# Patient Record
Sex: Male | Born: 1935 | Race: White | Hispanic: No | Marital: Married | Smoking: Never smoker
Health system: Southern US, Community
[De-identification: ages and names within clinical notes are randomized; demographics above are authoritative.]

## PROBLEM LIST (undated history)

## (undated) DIAGNOSIS — K219 Gastro-esophageal reflux disease without esophagitis: Secondary | ICD-10-CM

## (undated) DIAGNOSIS — I509 Heart failure, unspecified: Secondary | ICD-10-CM

## (undated) DIAGNOSIS — I10 Essential (primary) hypertension: Secondary | ICD-10-CM

## (undated) DIAGNOSIS — G629 Polyneuropathy, unspecified: Secondary | ICD-10-CM

## (undated) DIAGNOSIS — C801 Malignant (primary) neoplasm, unspecified: Secondary | ICD-10-CM

## (undated) DIAGNOSIS — I429 Cardiomyopathy, unspecified: Secondary | ICD-10-CM

## (undated) DIAGNOSIS — E785 Hyperlipidemia, unspecified: Secondary | ICD-10-CM

## (undated) DIAGNOSIS — I4891 Unspecified atrial fibrillation: Secondary | ICD-10-CM

## (undated) HISTORY — DX: Cardiomyopathy, unspecified: I42.9

## (undated) HISTORY — PX: VASECTOMY: SHX75

## (undated) HISTORY — DX: Malignant (primary) neoplasm, unspecified: C80.1

## (undated) HISTORY — DX: Hyperlipidemia, unspecified: E78.5

## (undated) HISTORY — PX: FRACTURE SURGERY: SHX138

## (undated) HISTORY — PX: HERNIA REPAIR: SHX51

## (undated) HISTORY — DX: Polyneuropathy, unspecified: G62.9

---

## 2004-11-21 DIAGNOSIS — C801 Malignant (primary) neoplasm, unspecified: Secondary | ICD-10-CM

## 2004-11-21 HISTORY — DX: Malignant (primary) neoplasm, unspecified: C80.1

## 2011-03-08 ENCOUNTER — Emergency Department: Payer: Self-pay | Admitting: Emergency Medicine

## 2011-06-03 DIAGNOSIS — E782 Mixed hyperlipidemia: Secondary | ICD-10-CM | POA: Insufficient documentation

## 2011-06-03 DIAGNOSIS — M545 Low back pain, unspecified: Secondary | ICD-10-CM | POA: Insufficient documentation

## 2011-06-03 DIAGNOSIS — R351 Nocturia: Secondary | ICD-10-CM | POA: Insufficient documentation

## 2011-12-07 ENCOUNTER — Ambulatory Visit: Payer: Self-pay

## 2012-09-17 DIAGNOSIS — S43409A Unspecified sprain of unspecified shoulder joint, initial encounter: Secondary | ICD-10-CM | POA: Insufficient documentation

## 2012-09-25 DIAGNOSIS — N400 Enlarged prostate without lower urinary tract symptoms: Secondary | ICD-10-CM | POA: Insufficient documentation

## 2012-10-24 DIAGNOSIS — H903 Sensorineural hearing loss, bilateral: Secondary | ICD-10-CM | POA: Insufficient documentation

## 2012-11-02 DIAGNOSIS — M75100 Unspecified rotator cuff tear or rupture of unspecified shoulder, not specified as traumatic: Secondary | ICD-10-CM | POA: Insufficient documentation

## 2012-12-28 DIAGNOSIS — M7520 Bicipital tendinitis, unspecified shoulder: Secondary | ICD-10-CM | POA: Insufficient documentation

## 2013-04-12 DIAGNOSIS — Z961 Presence of intraocular lens: Secondary | ICD-10-CM | POA: Insufficient documentation

## 2013-11-15 ENCOUNTER — Ambulatory Visit: Payer: Self-pay | Admitting: Family Medicine

## 2014-01-21 DIAGNOSIS — H02419 Mechanical ptosis of unspecified eyelid: Secondary | ICD-10-CM | POA: Insufficient documentation

## 2014-02-25 DIAGNOSIS — H02839 Dermatochalasis of unspecified eye, unspecified eyelid: Secondary | ICD-10-CM | POA: Insufficient documentation

## 2014-09-03 DIAGNOSIS — I482 Chronic atrial fibrillation, unspecified: Secondary | ICD-10-CM | POA: Insufficient documentation

## 2015-01-07 DIAGNOSIS — I071 Rheumatic tricuspid insufficiency: Secondary | ICD-10-CM | POA: Insufficient documentation

## 2015-05-12 DIAGNOSIS — I351 Nonrheumatic aortic (valve) insufficiency: Secondary | ICD-10-CM | POA: Insufficient documentation

## 2015-05-19 DIAGNOSIS — I493 Ventricular premature depolarization: Secondary | ICD-10-CM | POA: Insufficient documentation

## 2015-06-02 ENCOUNTER — Emergency Department
Admission: EM | Admit: 2015-06-02 | Discharge: 2015-06-02 | Disposition: A | Discharge status: Home / Self Care | Payer: Medicare Other | Attending: Emergency Medicine | Admitting: Emergency Medicine

## 2015-06-02 ENCOUNTER — Encounter: Payer: Self-pay | Admitting: *Deleted

## 2015-06-02 ENCOUNTER — Other Ambulatory Visit: Payer: Self-pay

## 2015-06-02 DIAGNOSIS — R55 Syncope and collapse: Secondary | ICD-10-CM | POA: Diagnosis not present

## 2015-06-02 DIAGNOSIS — Y9289 Other specified places as the place of occurrence of the external cause: Secondary | ICD-10-CM | POA: Insufficient documentation

## 2015-06-02 DIAGNOSIS — T675XXA Heat exhaustion, unspecified, initial encounter: Secondary | ICD-10-CM | POA: Insufficient documentation

## 2015-06-02 DIAGNOSIS — Y998 Other external cause status: Secondary | ICD-10-CM | POA: Diagnosis not present

## 2015-06-02 DIAGNOSIS — X30XXXA Exposure to excessive natural heat, initial encounter: Secondary | ICD-10-CM | POA: Insufficient documentation

## 2015-06-02 DIAGNOSIS — I10 Essential (primary) hypertension: Secondary | ICD-10-CM | POA: Diagnosis not present

## 2015-06-02 DIAGNOSIS — Y9389 Activity, other specified: Secondary | ICD-10-CM | POA: Diagnosis not present

## 2015-06-02 HISTORY — DX: Gastro-esophageal reflux disease without esophagitis: K21.9

## 2015-06-02 HISTORY — DX: Unspecified atrial fibrillation: I48.91

## 2015-06-02 HISTORY — DX: Essential (primary) hypertension: I10

## 2015-06-02 LAB — CBC WITH DIFFERENTIAL/PLATELET
BASOS PCT: 1 %
Basophils Absolute: 0.1 10*3/uL (ref 0–0.1)
Eosinophils Absolute: 0.1 10*3/uL (ref 0–0.7)
Eosinophils Relative: 1 %
HCT: 39.1 % — ABNORMAL LOW (ref 40.0–52.0)
HEMOGLOBIN: 13.2 g/dL (ref 13.0–18.0)
LYMPHS ABS: 2.3 10*3/uL (ref 1.0–3.6)
Lymphocytes Relative: 33 %
MCH: 32.4 pg (ref 26.0–34.0)
MCHC: 33.7 g/dL (ref 32.0–36.0)
MCV: 96.4 fL (ref 80.0–100.0)
MONO ABS: 0.5 10*3/uL (ref 0.2–1.0)
Monocytes Relative: 8 %
Neutro Abs: 4.1 10*3/uL (ref 1.4–6.5)
Neutrophils Relative %: 57 %
Platelets: 145 10*3/uL — ABNORMAL LOW (ref 150–440)
RBC: 4.06 MIL/uL — ABNORMAL LOW (ref 4.40–5.90)
RDW: 14.1 % (ref 11.5–14.5)
WBC: 7.1 10*3/uL (ref 3.8–10.6)

## 2015-06-02 LAB — BASIC METABOLIC PANEL
Anion gap: 9 (ref 5–15)
BUN: 13 mg/dL (ref 6–20)
CO2: 25 mmol/L (ref 22–32)
Calcium: 8.9 mg/dL (ref 8.9–10.3)
Chloride: 106 mmol/L (ref 101–111)
Creatinine, Ser: 1.33 mg/dL — ABNORMAL HIGH (ref 0.61–1.24)
GFR calc Af Amer: 56 mL/min — ABNORMAL LOW (ref >60)
GFR calc non Af Amer: 48 mL/min — ABNORMAL LOW (ref >60)
Glucose, Bld: 107 mg/dL — ABNORMAL HIGH (ref 65–99)
Potassium: 3.7 mmol/L (ref 3.5–5.1)
SODIUM: 140 mmol/L (ref 135–145)

## 2015-06-02 LAB — TROPONIN I: Troponin I: <0.03 ng/mL (ref <0.031)

## 2015-06-02 NOTE — ED Notes (Signed)
Per EMS:  Pt went on a walk this morning and was then working in the yard.  Became hot and felt faint.  Denies syncope, denies falling.  Pt A/O x 4.  Per EMS, on arrival, no radial pulse felt, hypotensive.  Pt denies CP, SOB.

## 2015-06-02 NOTE — ED Provider Notes (Signed)
[facility] Emergency Department Provider Note  ____________________________________________  Time seen: Seen upon arrival to the emergency department  I have reviewed the triage vital signs and the nursing notes.   HISTORY  Chief Complaint Heat Exposure    HPI Kenneth Watson is a 81 y.o. male with a history of atrial fibrillation and hypertension who presents today after having a near-syncopal episode. The patient said that he went on a 2 mile walk and then started doing yardwork. He said that he drank a third cup of coffee this morning and no water. While working in the are the patient became acutely diaphoretic with feelings of near syncope. Denies any nausea, chest pain or shortness of breath. He was able to lower himself to the ground without falling. Denies any pain at this time. No complaints at this time. Sustained an abrasion to his left upper extremity. Says that he just had a tetanus shot one month ago. Denies feeling his heart racing. Initially in the ambulance the patient did not have a detectable blood pressure however had a repeat pressure after fluids with his systolic in the 16X.   Past Medical History  Diagnosis Date  . Atrial fibrillation   . Hypertension   . GERD (gastroesophageal reflux disease)     There are no active problems to display for this patient.   Past Surgical History  Procedure Laterality Date  . Hernia repair    . Vasectomy      Current Outpatient Rx  Name  Route  Sig  Dispense  Refill  . apixaban (ELIQUIS) 5 MG TABS tablet   Oral   Take 5 mg by mouth 2 (two) times daily.         Marland Kitchen atenolol (TENORMIN) 25 MG tablet   Oral   Take 25 mg by mouth 2 (two) times daily.          Marland Kitchen omeprazole (PRILOSEC) 20 MG capsule   Oral   Take 20 mg by mouth daily.         Marland Kitchen PRESCRIPTION MEDICATION               . sacubitril-valsartan (ENTRESTO) 24-26 MG   Oral   Take 1 tablet by mouth 2 (two) times daily.            Allergies Septra and Sulfur  History reviewed. No pertinent family history.  Social History History  Substance Use Topics  . Smoking status: Never Smoker   . Smokeless tobacco: Not on file  . Alcohol Use: No    Review of Systems Constitutional: No fever/chills Eyes: No visual changes. ENT: No sore throat. Cardiovascular: Denies chest pain. Respiratory: Denies shortness of breath. Gastrointestinal: No abdominal pain.  No nausea, no vomiting.  No diarrhea.  No constipation. Genitourinary: Negative for dysuria. Musculoskeletal: Negative for back pain. Skin: Negative for rash. Neurological: Negative for headaches, focal weakness or numbness.  10-point ROS otherwise negative.  ____________________________________________   PHYSICAL EXAM:  VITAL SIGNS: ED Triage Vitals  Enc Vitals Group     BP 06/02/15 1359 97/64 mmHg     Pulse Rate 06/02/15 1359 75     Resp 06/02/15 1359 18     Temp 06/02/15 1359 98.1 F (36.7 C)     Temp Source 06/02/15 1359 Oral     SpO2 06/02/15 1359 100 %     Weight 06/02/15 1359 160 lb (72.576 kg)     Height 06/02/15 1359 5\' 10"  (1.778 m)  Head Cir --      Peak Flow --      Pain Score --      Pain Loc --      Pain Edu? --      Excl. in [place]? --     Constitutional: Alert and oriented. Well appearing and in no acute distress. Eyes: Conjunctivae are normal. PERRL. EOMI. Head: Atraumatic. Nose: No congestion/rhinnorhea. Mouth/Throat: Mucous membranes are moist.  Oropharynx non-erythematous. Neck: No stridor.   Cardiovascular: Irregularly irregular rhythm. Grossly normal heart sounds.  Good peripheral circulation. Respiratory: Normal respiratory effort.  No retractions. Lungs CTAB. Gastrointestinal: Soft and nontender. No distention. No abdominal bruits. No CVA tenderness. Musculoskeletal: No lower extremity tenderness nor edema.  No joint effusions. Neurologic:  Normal speech and language. No gross focal neurologic deficits are  appreciated. Speech is normal. No gait instability. Skin:  Skin is warm, dry and intact. No rash noted. Psychiatric: Mood and affect are normal. Speech and behavior are normal.  ____________________________________________   LABS (all labs ordered are listed, but only abnormal results are displayed)  Labs Reviewed  CBC WITH DIFFERENTIAL/PLATELET - Abnormal; Notable for the following:    RBC 4.06 (*)    HCT 39.1 (*)    Platelets 145 (*)    All other components within normal limits  BASIC METABOLIC PANEL - Abnormal; Notable for the following:    Glucose, Bld 107 (*)    Creatinine, Ser 1.33 (*)    GFR calc non Af Amer 48 (*)    GFR calc Af Amer 56 (*)    All other components within normal limits  TROPONIN I   ____________________________________________  EKG  ED ECG REPORT I, Doran Stabler, the attending physician, personally viewed and interpreted this ECG.   Date: 06/02/2015  EKG Time: 1358  Rate: 93  Rhythm: atrial fibrillation, rate 93  Axis: Normal axis  Intervals:Long QT interval  ST&T Change: T-wave inversion in aVF. PVC 2  No previous for comparison  ____________________________________________  RADIOLOGY   ____________________________________________   PROCEDURES    ____________________________________________   INITIAL IMPRESSION / ASSESSMENT AND PLAN / ED COURSE  Pertinent labs & imaging results that were available during my care of the patient were reviewed by me and considered in my medical decision making (see chart for details).  ----------------------------------------- 5:23 PM on 06/02/2015 -----------------------------------------  Patient with blood pressures now to the 90s. Last to systolic pressures being 96 and 92. Patient is currently asymptomatic. Labs are reassuring. Presentation consistent with patient being out in the heat for multiple hours today. Has an echo cardiogram scheduled tomorrow and then will follow-up with Dr.  Nehemiah Massed. Encouraged patient to drink plenty of water at home as well as 2 restrict his time outside in the heat. We will discharge to home. ____________________________________________   FINAL CLINICAL IMPRESSION(S) / ED DIAGNOSES   Acute heat exhaustion with near syncope. Initial visit.   Orbie Pyo, MD 06/02/15 1725

## 2015-06-02 NOTE — Discharge Instructions (Signed)
Heat Stress in the Elderly  Elderly people (people aged 79 years and older) are more prone to heat stress than younger people for several reasons:   Elderly people do not adjust as well as young people to sudden changes in temperature.  They are more likely to have a chronic medical condition that upsets normal body responses to heat.  They are more likely to take prescription medicines that impair the body's ability to regulate its temperature or that inhibit perspiration. HEAT STROKE  Heat stroke is the most serious heat-related illness. It occurs when the body becomes unable to control its temperature. The body temperature rises rapidly. Then the body loses its ability to sweat and is unable to cool down. The body temperature rises to 105 F (40.6 C) or higher within 10 to 15 minutes. Heat stroke can cause death or permanent disability if emergency treatment is not provided. SYMPTOMS  Warning signs vary but may include the following:  An extremely high body temperature (above 103 F (39.4 C)).  Nausea.  Red, hot, and dry skin (no sweating).  Rapid, strong pulse.  Throbbing headache.  Dizziness. HEAT EXHAUSTION  Heat exhaustion is a milder form of heat-related illness. It can develop after several days of exposure to high temperatures and not enough fluids. SYMPTOMS  Warning signs vary but may include the following:   Heavy sweating. Paleness.  Muscle cramps.  Tiredness. Weakness.  Dizziness.  Headache. Nausea or vomiting.  Fainting.  Skin: may be cool and moist.  Pulse rate: fast and weak.  Breathing: fast and shallow. WHAT YOU CAN DO TO PROTECT YOURSELF  You can follow these prevention tips to protect yourself from heat-related stress:   Drink cool, nonalcoholic, non-caffeinated beverages. If your caregiver generally limits the amount of fluid you drink or has you on water pills, ask how much you should drink when the weather is hot. Avoid extremely cold  liquids. They can cause cramps.  Rest.  Take a cool shower, bath, or sponge bath.  If possible, seek an air-conditioned environment. If you do not have air conditioning, visit an air-conditioned shopping mall or [place] to cool off.  Emergency planning/management officer.  If possible, remain indoors in the heat of the day.  Do not engage in strenuous activities. WHAT YOU CAN DO TO HELP PROTECT ELDERLY RELATIVES AND NEIGHBORS  If you have elderly relatives or neighbors, help them protect themselves from heat-related stress.   Visit older adults at risk at least twice a day. Watch them for signs of heat exhaustion or heat stroke.  Take them to air-conditioned locations if they have transportation problems.  Make sure older adults have access to an electric fan whenever possible. WHAT YOU CAN DO FOR SOMEONE WITH HEAT STRESS   If you see any signs of severe heat stress, you may be dealing with a life-threatening emergency. Have someone call for immediate medical assistance while you begin cooling the affected person. Do the following:  Get the person to a shady area.  Cool the person rapidly, using whatever methods you can. For example, immerse the person in a tub of cool water or place the person in a cool shower. Spray the person with cool water from a garden hose or sponge the person with cool water. If the humidity is low, wrap the person in a cool, wet sheet. Fan him/her quickly.  Monitor body temperature. Continue cooling efforts until the body temperature drops to 101 - 102F (38.3  C - 38.9  C).  If emergency medical personnel are delayed, call the hospital emergency room for further instructions.  Do not give the person alcohol to drink.  Get medical care as soon as possible. Document Released: 10/26/2009 Document Revised: 01/30/2012 Document Reviewed: 10/26/2009 [facility] Patient Information 2015 [place], [place]. This information is not intended to replace advice given to you  by your health care provider. Make sure you discuss any questions you have with your health care provider.

## 2016-06-28 DIAGNOSIS — N529 Male erectile dysfunction, unspecified: Secondary | ICD-10-CM | POA: Insufficient documentation

## 2017-03-23 DIAGNOSIS — I10 Essential (primary) hypertension: Secondary | ICD-10-CM | POA: Insufficient documentation

## 2017-03-23 DIAGNOSIS — R0981 Nasal congestion: Secondary | ICD-10-CM | POA: Insufficient documentation

## 2017-03-23 DIAGNOSIS — K219 Gastro-esophageal reflux disease without esophagitis: Secondary | ICD-10-CM | POA: Insufficient documentation

## 2017-04-04 DIAGNOSIS — M19041 Primary osteoarthritis, right hand: Secondary | ICD-10-CM | POA: Insufficient documentation

## 2017-04-04 DIAGNOSIS — G8929 Other chronic pain: Secondary | ICD-10-CM | POA: Insufficient documentation

## 2017-04-18 ENCOUNTER — Ambulatory Visit: Payer: Medicare Other | Attending: Internal Medicine | Admitting: Occupational Therapy

## 2017-04-18 ENCOUNTER — Encounter: Payer: Self-pay | Admitting: Occupational Therapy

## 2017-04-18 DIAGNOSIS — M79641 Pain in right hand: Secondary | ICD-10-CM

## 2017-04-18 DIAGNOSIS — M25641 Stiffness of right hand, not elsewhere classified: Secondary | ICD-10-CM | POA: Diagnosis present

## 2017-04-18 NOTE — Patient Instructions (Signed)
Reviewed with pt joint protection principles - avoid lateral grip  , larger joints , built up handles  AE info provided for writing , jars, opening packages , buttons   HEP for tendon glides   intrinsic blocked to increase PIP flexion  And then full fist to 2cm object - to align digits - not have middle finger cross over 4th and 5th

## 2017-04-18 NOTE — Therapy (Signed)
[place] [facility] [address] [place], [address] Phone: [phone]   Fax:  [phone]  Occupational Therapy Evaluation  Patient Details  Name: Kenneth Watson MRN: [id number] Date of Birth: Dec 26, 1930 Referring Provider: Meda Coffee  Encounter Date: 04/18/2017      OT End of Session - 04/18/17 1446    Visit Number 1   Number of Visits 3   Date for OT Re-Evaluation 05/16/17   OT Start Time 0932   OT Stop Time 1400   OT Time Calculation (min) 57 min   Activity Tolerance Patient tolerated treatment well   Behavior During Therapy [facility] for tasks assessed/performed      Past Medical History:  Diagnosis Date  . Atrial fibrillation ([place])   . Cancer ([place]) 2006   Squamous cell carcicoma  . Cardiomyopathy ([place])   . GERD (gastroesophageal reflux disease)   . Hyperlipidemia   . Hypertension   . Neuropathy     Past Surgical History:  Procedure Laterality Date  . FRACTURE SURGERY  83 yrs old   elbow  . HERNIA REPAIR    . VASECTOMY      There were no vitals filed for this visit.      Subjective Assessment - 04/18/17 1436    Subjective  My R hand - fingers when trying to do buttons, pinching objects - but just for that time - worse as day goes , gradually got like this    Patient Stated Goals Do not want my fingers to get worse - and see if anything that can help me with the pain when doing buttons or pinch with index and middle fingers    Currently in Pain? Yes   Pain Score 4    Pain Location Finger (Comment which one)   Pain Orientation Right   Pain Descriptors / Indicators Burning;Stabbing;Pins and needles   Pain Type Chronic pain   Pain Onset More than a month ago           [facility] OT Assessment - 04/18/17 0001      Assessment   Diagnosis R hand pain    Referring Provider Meda Coffee   Onset Date 04/04/17     Home  Environment   Lives With Spouse     Prior Function   Vocation Retired   Leisure Retired  Theme park manager, Audiological scientist - likes to read, cross work puzzles, house work , Stage manager, walk      Edema   Edema 3rd PIP R 7.7 , L 6.9 cm     Right Hand AROM   R Index  MCP 0-90 90 Degrees   R Index PIP 0-100 30 Degrees  PROM 44- ulnar deviated    R Index DIP 0-70 --  flexed   R Long  MCP 0-90 90 Degrees   R Long PIP 0-100 85 Degrees  PROM 90 - ulnar deviated    R Long DIP 0-70 --  flexed   R Ring  MCP 0-90 90 Degrees   R Ring PIP 0-100 100 Degrees   R Ring DIP 0-70 --  starting to flex   R Little  MCP 0-90 90 Degrees   R Little PIP 0-100 96 Degrees       parafin done to R hand to increase ROM prior to review of HEP  HEP review  Reviewed with pt joint protection principles - avoid lateral grip  , larger joints , built up handles  AE info  provided for writing , jars, opening packages , buttons   HEP for tendon glides   intrinsic blocked to increase PIP flexion  And then full fist to 2cm object - to align digits - not have middle finger cross over 4th and 5th                       OT Education - 04/18/17 1445    Education provided Yes   Education Details findings of eval , HEP    Person(s) Educated Patient   Methods Explanation;Demonstration;Tactile cues;Verbal cues;Handout   Comprehension Verbal cues required;Returned demonstration;Verbalized understanding          OT Short Term Goals - 04/18/17 1451      OT SHORT TERM GOAL #1   Title Pain in R hand decrease on PRWHE by 10 points    Baseline at eval PRWHE pain score 29/50    Time 3   Period Weeks     OT SHORT TERM GOAL #2   Title Pt to be independent in HEP to decrease pain and increase ROM in R hand    Time 3   Period Weeks   Status New           OT Long Term Goals - 04/18/17 1457      OT LONG TERM GOAL #1   Title Pt verbalize 3 joint protections/modifications and/or AE to decrease pain and increase ease of hands in ADL's and IADL's    Baseline very little knowledge    Time 4    Period Weeks   Status New               Plan - 04/18/17 1446    Clinical Impression Statement Pt present at eval with complains of R hand pain - mostly in 2nd thru 4th with pinching act like buttons - pt report pain during act , but not lingering  or afterwards , pt show decrease AROM in 2nd and 3rd mostly - with 2nd PIP the worse - pt show decrease grip  and lat grip in R hand compare to L -  middle phalanges ulnar deviatiing and pain at PIP and DIP - pt keep DIP's in flexion at 2nd thru 4th - pt provided with HEP for ROM , joint protection and AE to decreaes pain and force on digits - compression provided for 2nd and 3rd PIP  to possibly decrease pain    Occupational performance deficits (Please refer to evaluation for details): ADL's;IADL's;Play;Leisure   Rehab Potential Fair   OT Frequency 1x / week   OT Duration 4 weeks   OT Treatment/Interventions Self-care/ADL training;Parrafin;Manual Therapy;Passive range of motion;Therapeutic exercises;Patient/family education   OT Home Exercise Plan see pt instruction   Consulted and Agree with Plan of Care Patient      Patient will benefit from skilled therapeutic intervention in order to improve the following deficits and impairments:  Impaired flexibility, Decreased coordination, Increased edema, Pain, Impaired UE functional use, Decreased strength  Visit Diagnosis: Pain in right hand - Plan: Ot plan of care cert/re-cert  Stiffness of right hand, not elsewhere classified - Plan: Ot plan of care cert/re-cert      G-Codes - 53/29/92 1459    Functional Assessment Tool Used (Outpatient only) PRWHE, ROM , grip and prehension - clinical judgement    Functional Limitation Self care      Problem List There are no active problems to display for this patient.   Rosalyn Gess OTR/L,CLT  04/18/2017, 3:09 PM  [place] [facility] [address], [place], [address] Phone:  [phone]   Fax:  [phone]  Name: Kenneth Watson MRN: [id number] Date of Birth: 1931-02-03

## 2017-04-26 ENCOUNTER — Ambulatory Visit: Payer: Medicare Other | Attending: Internal Medicine | Admitting: Occupational Therapy

## 2017-04-26 DIAGNOSIS — M79641 Pain in right hand: Secondary | ICD-10-CM

## 2017-04-26 DIAGNOSIS — M25641 Stiffness of right hand, not elsewhere classified: Secondary | ICD-10-CM | POA: Diagnosis present

## 2017-04-26 NOTE — Therapy (Signed)
[place] [facility] [address], [place], [address] Phone: [phone]   Fax:  [phone]  Occupational Therapy Treatment/discharge   Patient Details  Name: Dannis Deroche MRN: [id number] Date of Birth: December 30, 1930 Referring Provider: Meda Coffee  Encounter Date: 04/26/2017      OT End of Session - 04/26/17 1448    Visit Number 2   Number of Visits 2   Date for OT Re-Evaluation 04/26/17   OT Start Time 1430   OT Stop Time 1445   OT Time Calculation (min) 15 min   Activity Tolerance Patient tolerated treatment well   Behavior During Therapy [facility] for tasks assessed/performed      Past Medical History:  Diagnosis Date  . Atrial fibrillation ([place])   . Cancer ([place]) 2006   Squamous cell carcicoma  . Cardiomyopathy ([place])   . GERD (gastroesophageal reflux disease)   . Hyperlipidemia   . Hypertension   . Neuropathy     Past Surgical History:  Procedure Laterality Date  . FRACTURE SURGERY  83 yrs old   elbow  . HERNIA REPAIR    . VASECTOMY      There were no vitals filed for this visit.      Subjective Assessment - 04/26/17 1447    Subjective  I did the exercises and use the compression sleeve on my index - but it do hurt when doing the exercises - I tried to modify how I hold and grip objects - but it is hard    Patient Stated Goals Do not want my fingers to get worse - and see if anything that can help me with the pain when doing buttons or pinch with index and middle fingers    Currently in Pain? Yes   Pain Score 3    Pain Location Finger (Comment which one)   Pain Orientation Right   Pain Descriptors / Indicators Burning;Shooting            [facility] OT Assessment - 04/26/17 0001      Strength   Right Hand Grip (lbs) 39  without using 2nd digit   Right Hand Lateral Pinch 10 lbs   Right Hand 3 Point Pinch 12 lbs   Left Hand Grip (lbs) 40   Left Hand Lateral Pinch 12 lbs   Left Hand 3 Point Pinch 11  lbs     Right Hand AROM   R Index  MCP 0-90 90 Degrees   R Index PIP 0-100 30 Degrees  hyper extention - [place]?   R Long  MCP 0-90 90 Degrees   R Long PIP 0-100 85 Degrees   R Ring  MCP 0-90 90 Degrees   R Ring PIP 0-100 100 Degrees   R Little  MCP 0-90 90 Degrees   R Little PIP 0-100 96 Degrees        AROM for digits assess and grip /prehension - see flowsheet  size of PIP joint same after wearing compression week on 3rd PIP  Review with pt again joint protection and AE   pt has knowledge                     OT Education - 04/26/17 1448    Education Details joint protection to cont with and modifications    Person(s) Educated Patient   Methods Explanation;Demonstration;Tactile cues   Comprehension Returned demonstration;Verbalized understanding          OT Short Term Goals -  04/26/17 1451      OT SHORT TERM GOAL #1   Title Pain in R hand decrease on PRWHE by 10 points    Baseline at eval PRWHE pain score 29/50 - pain still the same    Status Not Met     OT SHORT TERM GOAL #2   Title Pt to be independent in HEP to decrease pain and increase ROM in R hand    Baseline same ROM and pain    Status Not Met           OT Long Term Goals - 04/26/17 1451      OT LONG TERM GOAL #1   Title Pt verbalize 3 joint protections/modifications and/or AE to decrease pain and increase ease of hands in ADL's and IADL's    Status Achieved               Plan - 04/26/17 1449    Clinical Impression Statement Pt ROM , size of joints , and pain the same - pt not candidate for oval 8 splints because of size difference in joint and phalanges - grip and prehension did increase about 1-2 lbs in the R hand - pt was ed on joint  protection and modifications to avoid ulnar deviation and pain getting worse -  as well as Adaptive equipment - pt report voltarin ointment do help for the pain - pt to cont to maintain ROM and strength in bilateral hands - discharge from OT     Occupational performance deficits (Please refer to evaluation for details): ADL's;IADL's;Play;Leisure   OT Treatment/Interventions Self-care/ADL training;Parrafin;Manual Therapy;Passive range of motion;Therapeutic exercises;Patient/family education   Plan discharge with joint protection and AE    OT Home Exercise Plan see pt instruction   Consulted and Agree with Plan of Care Patient      Patient will benefit from skilled therapeutic intervention in order to improve the following deficits and impairments:     Visit Diagnosis: Pain in right hand  Stiffness of right hand, not elsewhere classified    Problem List There are no active problems to display for this patient.   Rosalyn Gess OTR/L,CLT 04/26/2017, 2:56 PM  [place] [facility] [address] [place], [address] Phone: [phone]   Fax:  [phone]  Name: Kregg Cihlar MRN: [id number] Date of Birth: 02-03-1931

## 2017-04-27 ENCOUNTER — Ambulatory Visit: Payer: Medicare Other | Admitting: Occupational Therapy

## 2017-10-25 DIAGNOSIS — H9193 Unspecified hearing loss, bilateral: Secondary | ICD-10-CM | POA: Insufficient documentation

## 2017-10-25 DIAGNOSIS — H6123 Impacted cerumen, bilateral: Secondary | ICD-10-CM | POA: Insufficient documentation

## 2017-11-27 DIAGNOSIS — I5022 Chronic systolic (congestive) heart failure: Secondary | ICD-10-CM | POA: Insufficient documentation

## 2018-04-15 ENCOUNTER — Emergency Department
Admission: EM | Admit: 2018-04-15 | Discharge: 2018-04-15 | Disposition: A | Discharge status: Home / Self Care | Payer: Medicare Other | Attending: Emergency Medicine | Admitting: Emergency Medicine

## 2018-04-15 ENCOUNTER — Encounter: Payer: Self-pay | Admitting: Emergency Medicine

## 2018-04-15 ENCOUNTER — Emergency Department: Payer: Medicare Other

## 2018-04-15 DIAGNOSIS — Z85828 Personal history of other malignant neoplasm of skin: Secondary | ICD-10-CM | POA: Diagnosis not present

## 2018-04-15 DIAGNOSIS — R0789 Other chest pain: Secondary | ICD-10-CM

## 2018-04-15 DIAGNOSIS — I10 Essential (primary) hypertension: Secondary | ICD-10-CM | POA: Diagnosis not present

## 2018-04-15 DIAGNOSIS — Z79899 Other long term (current) drug therapy: Secondary | ICD-10-CM | POA: Diagnosis not present

## 2018-04-15 LAB — BASIC METABOLIC PANEL
Anion gap: 6 (ref 5–15)
BUN: 12 mg/dL (ref 6–20)
CO2: 31 mmol/L (ref 22–32)
Calcium: 9 mg/dL (ref 8.9–10.3)
Chloride: 99 mmol/L — ABNORMAL LOW (ref 101–111)
Creatinine, Ser: 0.86 mg/dL (ref 0.61–1.24)
GFR calc Af Amer: >60 mL/min (ref >60)
GFR calc non Af Amer: >60 mL/min (ref >60)
Glucose, Bld: 100 mg/dL — ABNORMAL HIGH (ref 65–99)
Potassium: 3.8 mmol/L (ref 3.5–5.1)
Sodium: 136 mmol/L (ref 135–145)

## 2018-04-15 LAB — CBC
HCT: 39.2 % — ABNORMAL LOW (ref 40.0–52.0)
Hemoglobin: 13.6 g/dL (ref 13.0–18.0)
MCH: 33.5 pg (ref 26.0–34.0)
MCHC: 34.8 g/dL (ref 32.0–36.0)
MCV: 96.5 fL (ref 80.0–100.0)
Platelets: 182 10*3/uL (ref 150–440)
RBC: 4.07 MIL/uL — ABNORMAL LOW (ref 4.40–5.90)
RDW: 14.3 % (ref 11.5–14.5)
WBC: 7.9 10*3/uL (ref 3.8–10.6)

## 2018-04-15 LAB — TROPONIN I

## 2018-04-15 LAB — HEPATIC FUNCTION PANEL
ALBUMIN: 3.8 g/dL (ref 3.5–5.0)
ALT: 18 U/L (ref 17–63)
AST: 21 U/L (ref 15–41)
Alkaline Phosphatase: 52 U/L (ref 38–126)
BILIRUBIN TOTAL: 0.9 mg/dL (ref 0.3–1.2)
Bilirubin, Direct: 0.2 mg/dL (ref 0.1–0.5)
Indirect Bilirubin: 0.7 mg/dL (ref 0.3–0.9)
Total Protein: 6.8 g/dL (ref 6.5–8.1)

## 2018-04-15 LAB — LIPASE, BLOOD: Lipase: 29 U/L (ref 11–51)

## 2018-04-15 MED ORDER — IOPAMIDOL (ISOVUE-370) INJECTION 76%
75.0000 mL | Freq: Once | INTRAVENOUS | Status: AC | PRN
  Administered 2018-04-15: 75 mL via INTRAVENOUS

## 2018-04-15 MED ORDER — ACETAMINOPHEN 325 MG PO TABS
650.0000 mg | ORAL_TABLET | Freq: Once | ORAL | Status: AC
  Administered 2018-04-15: 650 mg via ORAL
  Filled 2018-04-15: qty 2

## 2018-04-15 MED ORDER — GI COCKTAIL ~~LOC~~
30.0000 mL | Freq: Once | ORAL | Status: AC
  Administered 2018-04-15: 30 mL via ORAL
  Filled 2018-04-15: qty 30

## 2018-04-15 NOTE — Discharge Instructions (Signed)
Please continue taking the Eliquis as was prescribed before, if you have worsening chest pain shortness of breath, or any other new or worrisome symptoms please return to the emergency room.  We do advise taking Tylenol or ibuprofen as tolerated for this discomfort.  At this time there is no evidence of a blood clot pneumonia pneumothorax or heart attack and we are very relieved by all of your findings here.  Your cardiologist would like you to go home but again if you feel worse in any significant way please return to the emergency room right away.  Otherwise, given that this is been going on for several days and her work-up here is so reassuring, cardiology would like to see you on Monday.  Please call Dr. Nehemiah Massed for an appointment Monday morning.

## 2018-04-15 NOTE — ED Provider Notes (Addendum)
[facility] Emergency Department Provider Note  ____________________________________________   I have reviewed the triage vital signs and the nursing notes. Where available I have reviewed prior notes and, if possible and indicated, outside hospital notes.    HISTORY  Chief Complaint Chest Pain    HPI Kenneth Watson is a 84 y.o. male who has a history of atrial fibrillation, BPH cardiomyopathy EF of 40% per last cardiology note, hypertension, however they have been weaning him off his blood pressure medications, presents with chest discomfort this been there since Friday.  He states that he noticed it in the evening.  It was positional, meaning it was worse when he lay on his sides and better when he sat up.  It hurts when he breathes but he has no other shortness of breath.  He states he is also being tapered off of his omeprazole and has had numerous changes in medication recently he was recently taken off of his Entresto due to hypotension and presyncope and was started on metoprolol, he had a presyncopal event several weeks ago and was taken off of the metoprolol but now is being gradually put back on.  He was supposed to be on taking Eliquis but it is his belief that the cardiologist told him to stop taking so he stopped taking it for the last week or so.  Patient states that he has had this chest discomfort which is mostly in the left and substernal region, it is an uncomfortable feeling, no radiation.  Is there all the time without stop, it is worse when he lies on certain ways or changes position or takes a deep breath.  It is better when he sits still.  It is not exertional, he does not seem to have worsening symptoms when he walks his only certain positions make it worse.  It is not something he has had before.  He denies any abdominal pain, there may be some worsening with food.  Has had no melena bright red blood per rectum no fever no chills.  He is having the pain  at this moment.  Has had now uninterruptedly for 3 days.  No other associated symptoms. No leg swelling.   Past Medical History:  Diagnosis Date  . Atrial fibrillation ([place])   . Cancer ([place]) 2006   Squamous cell carcicoma  . Cardiomyopathy ([place])   . GERD (gastroesophageal reflux disease)   . Hyperlipidemia   . Hypertension   . Neuropathy     There are no active problems to display for this patient.   Past Surgical History:  Procedure Laterality Date  . FRACTURE SURGERY  84 yrs old   elbow  . HERNIA REPAIR    . VASECTOMY      Prior to Admission medications   Medication Sig Start Date End Date Taking? Authorizing Provider  cholecalciferol (VITAMIN D) 1000 units tablet Take 1,000 Units by mouth daily.   Yes [provider]  metoprolol succinate (TOPROL-XL) 25 MG 24 hr tablet Take 25 mg by mouth daily. 03/28/18  Yes [provider]  omeprazole (PRILOSEC) 20 MG capsule Take 20 mg by mouth daily as needed (GERD/heartburn).   Yes [provider]  vitamin B-12 (CYANOCOBALAMIN) 1000 MCG tablet Take 1,000 mcg by mouth daily.   Yes [provider]    Allergies Septra [sulfamethoxazole-trimethoprim]; Statins; and Sulfur  No family history on file.  Social History Social History   Tobacco Use  . Smoking status: Never Smoker  . Smokeless  tobacco: Never Used  Substance Use Topics  . Alcohol use: No  . Drug use: No    Review of Systems Constitutional: No fever/chills Eyes: No visual changes. ENT: No sore throat. No stiff neck no neck pain Cardiovascular: + chest pain. Respiratory: Denies shortness of breath. Gastrointestinal:   no vomiting.  No diarrhea.  No constipation. Genitourinary: Negative for dysuria. Musculoskeletal: Negative lower extremity swelling Skin: Negative for rash. Neurological: Negative for severe headaches, focal weakness or numbness.   ____________________________________________   PHYSICAL EXAM:  VITAL  SIGNS: ED Triage Vitals  Enc Vitals Group     BP 04/15/18 1814 136/84     Pulse Rate 04/15/18 1814 81     Resp 04/15/18 1814 18     Temp 04/15/18 1814 98.1 F (36.7 C)     Temp Source 04/15/18 1814 Oral     SpO2 04/15/18 1814 98 %     Weight 04/15/18 1810 160 lb (72.6 kg)     Height 04/15/18 1810 5\' 10"  (1.778 m)     Head Circumference --      Peak Flow --      Pain Score 04/15/18 1810 3     Pain Loc --      Pain Edu? --      Excl. in [place]? --     Constitutional: Alert and oriented. Well appearing and in no acute distress. Eyes: Conjunctivae are normal Head: Atraumatic HEENT: No congestion/rhinnorhea. Mucous membranes are moist.  Oropharynx non-erythematous Neck:   Nontender with no meningismus, no masses, no stridor Cardiovascular: Normal rate, regular rhythm. Grossly normal heart sounds.  Good peripheral circulation. Respiratory: Normal respiratory effort.  No retractions. Lungs CTAB. Abdominal: Soft and nontender. No distention. No guarding no rebound Back:  There is no focal tenderness or step off.  there is no midline tenderness there are no lesions noted. there is no CVA tenderness Musculoskeletal: No lower extremity tenderness, no upper extremity tenderness. No joint effusions, no DVT signs strong distal pulses no edema Neurologic:  Normal speech and language. No gross focal neurologic deficits are appreciated.  Skin:  Skin is warm, dry and intact. No rash noted. Psychiatric: Mood and affect are normal. Speech and behavior are normal.  ____________________________________________   LABS (all labs ordered are listed, but only abnormal results are displayed)  Labs Reviewed  BASIC METABOLIC PANEL - Abnormal; Notable for the following components:      Result Value   Chloride 99 (*)    Glucose, Bld 100 (*)    All other components within normal limits  CBC - Abnormal; Notable for the following components:   RBC 4.07 (*)    HCT 39.2 (*)    All other components within  normal limits  TROPONIN I  HEPATIC FUNCTION PANEL  LIPASE, BLOOD    Pertinent labs  results that were available during my care of the patient were reviewed by me and considered in my medical decision making (see chart for details). ____________________________________________  EKG  I personally interpreted any EKGs ordered by me or triage Atrial fibrillation rate 88 bpm, LVH noted, no acute ST elevation or depression, no acute ischemic changes ____________________________________________  RADIOLOGY  Pertinent labs & imaging results that were available during my care of the patient were reviewed by me and considered in my medical decision making (see chart for details). If possible, patient and/or family made aware of any abnormal findings.  Dg Chest 2 View  Result Date: 04/15/2018 CLINICAL DATA:  Chest pressure.  Tachycardia. EXAM: CHEST - 2 VIEW COMPARISON:  None. FINDINGS: Mild hyperinflation. Lateral view degraded by patient arm position. Midline trachea. Mild cardiomegaly. Atherosclerosis in the transverse aorta. No pleural effusion or pneumothorax. Biapical pleuroparenchymal scarring is mild. No congestive failure. Left lower lobe calcified granuloma. IMPRESSION: No acute cardiopulmonary disease. Cardiomegaly.  Aortic Atherosclerosis (ICD10-I70.0). Electronically Signed   By: Abigail Miyamoto M.D.   On: 04/15/2018 19:06   Ct Angio Chest Pe W And/or Wo Contrast  Result Date: 04/15/2018 CLINICAL DATA:  Chest discomfort that has been ongoing since Friday. Patient states when he takes a deep breath it gets worse. He had some tachycardia about a week ago. EXAM: CT ANGIOGRAPHY CHEST WITH CONTRAST TECHNIQUE: Multidetector CT imaging of the chest was performed using the standard protocol during bolus administration of intravenous contrast. Multiplanar CT image reconstructions and MIPs were obtained to evaluate the vascular anatomy. CONTRAST:  32mL ISOVUE-370 IOPAMIDOL (ISOVUE-370) INJECTION 76%  COMPARISON:  None. FINDINGS: Cardiovascular: Satisfactory opacification of the pulmonary arteries to the segmental level. No evidence of pulmonary embolism. Stable cardiomegaly. No pericardial effusion. Normal caliber thoracic aorta. Thoracic aortic atherosclerosis. Coronary artery atherosclerosis in the LAD and circumflex coronary artery. Mediastinum/Nodes: No enlarged mediastinal, hilar, or axillary lymph nodes. Thyroid gland, trachea, and esophagus demonstrate no significant findings. Lungs/Pleura: No focal consolidation. Calcified left lower lobe pulmonary nodule likely secondary to prior granulomatous disease. No pleural effusion or pneumothorax. Upper Abdomen: No acute abnormality. Punctate calcifications in the spleen likely secondary to prior granulomatous disease. Musculoskeletal: No acute osseous abnormality. No aggressive osseous lesion. Review of the MIP images confirms the above findings. IMPRESSION: 1. No evidence of pulmonary embolus. 2.  Aortic Atherosclerosis (ICD10-I70.0). Electronically Signed   By: Kathreen Devoid   On: 04/15/2018 20:40   ____________________________________________    PROCEDURES  Procedure(s) performed: None  Procedures  Critical Care performed: None  ____________________________________________   INITIAL IMPRESSION / ASSESSMENT AND PLAN / ED COURSE  Pertinent labs & imaging results that were available during my care of the patient were reviewed by me and considered in my medical decision making (see chart for details).  Patient not taking his Eliquis does have atrial fibrillation has some degree of pleuritic but mostly positional chest discomfort which is uninterrupted now for several days.  Initial troponin is negative, EKG shows no acute ischemia, abdomen is benign on serial exams, unclear etiology for his chest discomfort although it seems most likely to be musculoskeletal there is some minimal reproducibility to it.  Stomach etiology is also possible as  the patient is been taken off of his omeprazole or is at least taking it less frequently.  In any event, because of the pleuritic nature and the fact that he is off anticoagulation I did do a CT scan which is negative for any acute pathology.  Troponin is negative all of his blood work is thus far reassuring, he is in no acute distress and is again it only if he changes position that it really hurts a lot.  We will send a second troponin and we will discuss with cardiology.   ----------------------------------------- 9:59 PM on 04/15/2018 -----------------------------------------  Patient remains with a completely benign abdomen, and a minimal sense of discomfort especially when he lies on his side or moves the wrong way in the bed.  CT scan is negative, blood work is reassuring I discussed with Dr. Josefa Half of cardiology, and he is covering for the patient's primary cardiologist today we discussed all of his findings his history his  recent med changes his vital signs and CT scan has history of chest pain etc.  Cardiology feels that there is no indication for admission to this pain they do not think is likely a cardiology etiology.  There is no evidence of pericarditis saw there is no pericardial effusion is no evidence of PE pneumonia pneumothorax, there is no evidence of abscess or shingles there is no evidence of intra-abdominal pathology causing this, there is no evidence of myocarditis or dissection or aortitis or any other acute intra-abdominal or intrathoracic pathology today.  Patient and family very reassured by our findings so far, we will see if Tylenol helps his discomfort as he has not tried anything we will recommend that they restart Eliquis because I think that was discontinued by patient in error, vital signs are reassuring and if second troponin is negative with 3 days of uninterrupted positional discomfort it is the thought of cardiology that he should go home and they will see him on  Tuesday and I do not think this is unreasonable.       ____________________________________________   FINAL CLINICAL IMPRESSION(S) / ED DIAGNOSES  Final diagnoses:  None      This chart was dictated using voice recognition software.  Despite best efforts to proofread,  errors can occur which can change meaning.      Schuyler Amor, MD 04/15/18 2155    Schuyler Amor, MD 04/15/18 2201

## 2018-04-15 NOTE — ED Triage Notes (Addendum)
Pt comes into the ED via POV c/o chest discomfort that has been ongoing since Friday.  Patient states when he takes a deep breath it gets worse.  He had some tachycardia about a week ago.  Patient has h/o CHF and atrial fibrillation. Denies any shortness of breath, dizziness, or nausea.

## 2018-04-19 ENCOUNTER — Ambulatory Visit
Admission: RE | Admit: 2018-04-19 | Discharge: 2018-04-19 | Disposition: A | Discharge status: Home / Self Care | Payer: Medicare Other | Source: Ambulatory Visit | Attending: Family Medicine | Admitting: Family Medicine

## 2018-04-19 ENCOUNTER — Other Ambulatory Visit: Payer: Self-pay | Admitting: Family Medicine

## 2018-04-19 DIAGNOSIS — R0789 Other chest pain: Secondary | ICD-10-CM | POA: Diagnosis present

## 2018-04-19 DIAGNOSIS — J9811 Atelectasis: Secondary | ICD-10-CM | POA: Diagnosis not present

## 2018-09-14 ENCOUNTER — Encounter: Payer: Self-pay | Admitting: Urology

## 2018-09-14 ENCOUNTER — Ambulatory Visit (INDEPENDENT_AMBULATORY_CARE_PROVIDER_SITE_OTHER): Payer: Medicare Other | Admitting: Urology

## 2018-09-14 VITALS — BP 100/58 | HR 89 | Ht 70.0 in | Wt 160.0 lb

## 2018-09-14 DIAGNOSIS — R209 Unspecified disturbances of skin sensation: Secondary | ICD-10-CM

## 2018-09-14 DIAGNOSIS — R351 Nocturia: Secondary | ICD-10-CM | POA: Diagnosis not present

## 2018-09-14 DIAGNOSIS — R350 Frequency of micturition: Secondary | ICD-10-CM

## 2018-09-14 LAB — BLADDER SCAN AMB NON-IMAGING

## 2018-09-14 NOTE — Progress Notes (Signed)
09/14/2018 10:06 AM   Kenneth Watson 05/29/1931 [id number]  Referring provider: Baron Sane, MD [address] [place], [place] [address]  Chief Complaint  Patient presents with  . Benign Prostatic Hypertrophy    new patient    HPI: 84 year old male who presents today for further evaluation of BPH, nocturia and sense of pelvic vibration.  He is a personal history of BPH, previously followed by a urologist at [facility] who is since retired or left.  His symptoms have been well controlled for 25 years with an excellent urinary stream with adequate emptying.  He had a TURP 25 years ago and has been asymptomatic other than nocturia x 3 since.  Today his primary complaint is nocturia x3 for many years.  He reports that he gets up at night 3 times to urinate.  He generally stops drinking beverages in the evening around suppertime and does not drink again until the morning.  He does have a personal history of mild CHF.  He has minimal lower extremity edema.  He is not taking Lasix.  He does have a personal history of snoring.  Never had a sleep study.    He was primarily referred for a sensation of vibrating in his pelvic/perineal area.  He describes this area at the base of his penis.  He reports that it happened about once per year for the last several years and it last 1 to 2 days.  He describes a sensation as intermittent, not constant.  He describes a sensation of nonpainful vibrating at the base of the penis without any alleviating or exacerbating factors.  It is unrelated to voiding.  He describes no additional neurological symptoms and some "permanent nerve damage" in his upper extremity residual from shingles.  No dysuria or gross hematuria.  No UTIs.  No history of bladder stones.    IPSS    Row Name 09/14/18 1400         International Prostate Symptom Score   How often have you had the sensation of not emptying your bladder?  Less than 1 in 5     How often have you had to urinate  less than every two hours?  Less than half the time     How often have you found you stopped and started again several times when you urinated?  Not at All     How often have you found it difficult to postpone urination?  About half the time     How often have you had a weak urinary stream?  Not at All     How often have you had to strain to start urination?  Not at All     How many times did you typically get up at night to urinate?  3 Times     Total IPSS Score  9       Quality of Life due to urinary symptoms   If you were to spend the rest of your life with your urinary condition just the way it is now how would you feel about that?  Mostly Satisfied        Score:  1-7 Mild 8-19 Moderate 20-35 Severe    PMH: Past Medical History:  Diagnosis Date  . Atrial fibrillation ([place])   . Cancer ([place]) 2006   Squamous cell carcicoma  . Cardiomyopathy ([place])   . GERD (gastroesophageal reflux disease)   . Hyperlipidemia   . Hypertension   . Neuropathy     Surgical  History: Past Surgical History:  Procedure Laterality Date  . FRACTURE SURGERY  84 yrs old   elbow  . HERNIA REPAIR    . VASECTOMY      Home Medications:  Allergies as of 09/14/2018      Reactions   Septra [sulfamethoxazole-trimethoprim] Hives   Statins Other (See Comments)   Memory issues   Sulfur Hives      Medication List        Accurate as of 09/14/18 11:59 PM. Always use your most recent med list.          ELIQUIS 5 MG Tabs tablet Generic drug:  apixaban   metoprolol succinate 25 MG 24 hr tablet Commonly known as:  TOPROL-XL Take 25 mg by mouth daily.   montelukast 10 MG tablet Commonly known as:  SINGULAIR       Allergies:  Allergies  Allergen Reactions  . Septra [Sulfamethoxazole-Trimethoprim] Hives  . Statins Other (See Comments)    Memory issues  . Sulfur Hives    Family History: History reviewed. No pertinent family history.  Social History:  reports that he has never smoked.  He has never used smokeless tobacco. He reports that he does not drink alcohol or use drugs.  ROS: UROLOGY Frequent Urination?: Yes Hard to postpone urination?: Yes Burning/pain with urination?: No Get up at night to urinate?: Yes Leakage of urine?: Yes Urine stream starts and stops?: No Trouble starting stream?: No Do you have to strain to urinate?: No Blood in urine?: No Urinary tract infection?: No Sexually transmitted disease?: No Injury to kidneys or bladder?: No Painful intercourse?: No Weak stream?: No Erection problems?: Yes Penile pain?: No  Gastrointestinal Nausea?: No Vomiting?: No Indigestion/heartburn?: Yes Diarrhea?: No Constipation?: Yes  Constitutional Fever: No Night sweats?: No Weight loss?: No Fatigue?: No  Skin Skin rash/lesions?: No Itching?: No  Eyes Blurred vision?: No Double vision?: No  Ears/Nose/Throat Sore throat?: No Sinus problems?: No  Hematologic/Lymphatic Swollen glands?: No Easy bruising?: Yes  Cardiovascular Leg swelling?: Yes Chest pain?: No  Respiratory Cough?: No Shortness of breath?: Yes  Endocrine Excessive thirst?: No  Musculoskeletal Back pain?: No Joint pain?: Yes  Neurological Headaches?: No Dizziness?: No  Psychologic Depression?: No Anxiety?: No  Physical Exam: BP (!) 100/58   Pulse 89   Ht 5\' 10"  (1.778 m)   Wt 160 lb (72.6 kg)   BMI 22.96 kg/m   Constitutional:  Alert and oriented, No acute distress. HEENT: [place] AT, moist mucus membranes.  Trachea midline, no masses. Cardiovascular: No clubbing, cyanosis, or edema. Respiratory: Normal respiratory effort, no increased work of breathing. GI: Abdomen is soft, nontender, nondistended, no abdominal masses GU: Complex phallus with orthotopic meatus.  Bilateral descended testicles without masses or lesions.  Perineum intact, unremarkable. Rectal: Normal sphincter tone.  50+ cc prostate, nontender, no nodules.  Skin: No rashes, bruises or  suspicious lesions. Neurologic: Grossly intact, no focal deficits, moving all 4 extremities. Psychiatric: Normal mood and affect.  Laboratory Data: Lab Results  Component Value Date   WBC 7.9 04/15/2018   HGB 13.6 04/15/2018   HCT 39.2 (L) 04/15/2018   MCV 96.5 04/15/2018   PLT 182 04/15/2018    Lab Results  Component Value Date   CREATININE 0.86 04/15/2018     Pertinent Imaging: Results for orders placed or performed in visit on 09/14/18  BLADDER SCAN AMB NON-IMAGING  Result Value Ref Range   Scan Result 79ml     Assessment & Plan:    1.  Nocturia Nocturia x3 with minimal daytime voiding symptoms Overall, only mildly disturbed by this We discussed that with isolated nighttime symptoms, underlying condition such as undiagnosed untreated sleep apnea may be contributing factors We discussed the pathophysiology of nocturia including hormonal changes with age as well as comorbidities that may be contributing to the symptoms Behavior modification was discussed We discussed alternatives including voiding diary, trial of medication, referral for sleep study all discussed, at this point time he is deferred any further evaluation or treatment Adequate bladder emptying today - BLADDER SCAN AMB NON-IMAGING  2. Urinary frequency Primarily nighttime frequency, minimal daytime symptoms Behavior modification again reviewed  3. Sensation problem Isolated episodes of perineal vibration, has occurred on 3 separate occasions over past 3 years and self-limited Exam today unremarkable We discussed differential diagnosis as well as possible further diagnostic evaluation including cystoscopy/ultrasound Etiology unclear however given the relatively infrequent occurrences and chronicity, this is not likely pathologic Patient is relatively unbothered and desires no further evaluation   Return if symptoms worsen or fail to improve.  Hollice Espy, MD  [facility] [address] [place], [place] [address] [phone]

## 2018-10-02 ENCOUNTER — Ambulatory Visit: Payer: Self-pay | Admitting: Urology

## 2019-03-25 ENCOUNTER — Ambulatory Visit: Payer: Medicare Other

## 2019-03-25 ENCOUNTER — Other Ambulatory Visit: Payer: Self-pay

## 2019-03-25 ENCOUNTER — Ambulatory Visit
Admission: EM | Admit: 2019-03-25 | Discharge: 2019-03-25 | Disposition: A | Discharge status: Home / Self Care | Payer: Medicare Other | Attending: Family Medicine | Admitting: Family Medicine

## 2019-03-25 DIAGNOSIS — R0789 Other chest pain: Secondary | ICD-10-CM

## 2019-03-25 DIAGNOSIS — W01198A Fall on same level from slipping, tripping and stumbling with subsequent striking against other object, initial encounter: Secondary | ICD-10-CM | POA: Diagnosis not present

## 2019-03-25 NOTE — ED Triage Notes (Signed)
Patient complains of fall that occurred while at [employer]. Patient states that he fell on to the pavement and landed on his chest. Patient states that chest is now sore.

## 2019-03-25 NOTE — Discharge Instructions (Signed)
Rest.  Tylenol as needed.  Take care  Dr. Lacinda Axon

## 2019-03-25 NOTE — ED Provider Notes (Signed)
[facility]    CSN: [id number] Arrival date & time: 03/25/19  1151  History   Chief Complaint Chief Complaint  Patient presents with  . Fall  . Chest Pain   HPI 85 year old male presents chest pain after suffering a fall.  Patient states that he was at [facility] today.  He was adjusting his mask and subsequently fell down on the pavement.  He states that he fell directly forward and hit his chest.  Patient reports that his chest is currently sore.  Bilateral.  Moderate pain.  Worse with taking a deep breath.  Patient states that he also busted his right upper lip.  No current bleeding.  No reports of shortness of breath.  No medications or interventions tried.  No other associated symptoms.  No other complaints.   History reviewed and updated as below.  Past Medical History:  Diagnosis Date  . Atrial fibrillation ([place])   . Cancer ([place]) 2006   Squamous cell carcicoma  . Cardiomyopathy ([place])   . GERD (gastroesophageal reflux disease)   . Hyperlipidemia   . Hypertension   . Neuropathy    Past Surgical History:  Procedure Laterality Date  . FRACTURE SURGERY  85 yrs old   elbow  . HERNIA REPAIR    . VASECTOMY      Home Medications    Prior to Admission medications   Medication Sig Start Date End Date Taking? Authorizing Provider  ELIQUIS 5 MG TABS tablet  08/27/18  Yes [provider]  metoprolol succinate (TOPROL-XL) 25 MG 24 hr tablet Take 25 mg by mouth daily. 03/28/18  Yes [provider]  montelukast (SINGULAIR) 10 MG tablet  08/10/18  Yes [provider]   Social History Social History   Tobacco Use  . Smoking status: Never Smoker  . Smokeless tobacco: Never Used  Substance Use Topics  . Alcohol use: No  . Drug use: No     Allergies   Septra [sulfamethoxazole-trimethoprim]; Statins; and Sulfur   Review of Systems Review of Systems  Constitutional: Negative.   Respiratory: Negative for shortness of breath.    Cardiovascular: Positive for chest pain.   Physical Exam Triage Vital Signs ED Triage Vitals  Enc Vitals Group     BP 03/25/19 1159 (!) 117/93     Pulse Rate 03/25/19 1159 86     Resp 03/25/19 1159 18     Temp 03/25/19 1159 97.8 F (36.6 C)     Temp Source 03/25/19 1159 Oral     SpO2 03/25/19 1159 98 %     Weight 03/25/19 1157 150 lb (68 kg)     Height 03/25/19 1157 5\' 10"  (1.778 m)     Head Circumference --      Peak Flow --      Pain Score 03/25/19 1157 5     Pain Loc --      Pain Edu? --      Excl. in [place]? --    Updated Vital Signs BP (!) 117/93 (BP Location: Left Arm)   Pulse 86   Temp 97.8 F (36.6 C) (Oral)   Resp 18   Ht 5\' 10"  (1.778 m)   Wt 68 kg   SpO2 98%   BMI 21.52 kg/m   Visual Acuity Right Eye Distance:   Left Eye Distance:   Bilateral Distance:    Right Eye Near:   Left Eye Near:    Bilateral Near:     Physical Exam Vitals signs  and nursing note reviewed.  Constitutional:      General: He is not in acute distress.    Appearance: Normal appearance.  HENT:     Head:     Comments: Small raised area to the right side of his upper lip.  No bleeding.    Nose: Nose normal.  Eyes:     General:        Right eye: No discharge.        Left eye: No discharge.     Conjunctiva/sclera: Conjunctivae normal.  Cardiovascular:     Rate and Rhythm: Normal rate. Rhythm irregularly irregular.  Pulmonary:     Effort: Pulmonary effort is normal.     Breath sounds: Normal breath sounds. No wheezing, rhonchi or rales.  Chest:     Chest wall: No tenderness.  Neurological:     General: No focal deficit present.     Mental Status: He is alert.  Psychiatric:        Mood and Affect: Mood normal.        Behavior: Behavior normal.    UC Treatments / Results  Labs (all labs ordered are listed, but only abnormal results are displayed) Labs Reviewed - No data to display  EKG None  Radiology Dg Chest 2 View  Result Date: 03/25/2019 CLINICAL DATA:  Upper  chest pain due to a fall today. EXAM: CHEST - 2 VIEW COMPARISON:  PA and lateral chest 04/19/2018.  CT chest 04/15/2018. FINDINGS: Lungs are clear. Heart size is enlarged. No pneumothorax or pleural effusion. Aortic atherosclerosis noted. Eventration of the left hemidiaphragm is seen. No acute bony abnormality. IMPRESSION: No acute disease. Atherosclerosis. Electronically Signed   By: Inge Rise M.D.   On: 03/25/2019 12:33    Procedures Procedures (including critical care time)  Medications Ordered in UC Medications - No data to display  Initial Impression / Assessment and Plan / UC Course  I have reviewed the triage vital signs and the nursing notes.  Pertinent labs & imaging results that were available during my care of the patient were reviewed by me and considered in my medical decision making (see chart for details).    85 year old male presents with chest pain after suffering a fall.  Chest x-ray negative.  Clinical picture consistent with chest wall pain from fall.  Tylenol as needed.  Supportive care.  Final Clinical Impressions(s) / UC Diagnoses   Final diagnoses:  Chest wall pain     Discharge Instructions     Rest.  Tylenol as needed.  Take care  Dr. Lacinda Axon    ED Prescriptions    None     Controlled Substance Prescriptions [place] Controlled Substance Registry consulted? Not Applicable   Coral Spikes, DO 03/25/19 1318

## 2019-11-12 ENCOUNTER — Ambulatory Visit
Admission: EM | Admit: 2019-11-12 | Discharge: 2019-11-12 | Disposition: A | Discharge status: Other Acute Inpatient Hospital | Payer: Medicare Other | Attending: Emergency Medicine | Admitting: Emergency Medicine

## 2019-11-12 ENCOUNTER — Encounter: Payer: Self-pay | Admitting: Emergency Medicine

## 2019-11-12 ENCOUNTER — Other Ambulatory Visit: Payer: Self-pay

## 2019-11-12 DIAGNOSIS — R5383 Other fatigue: Secondary | ICD-10-CM | POA: Diagnosis present

## 2019-11-12 DIAGNOSIS — I959 Hypotension, unspecified: Secondary | ICD-10-CM | POA: Diagnosis present

## 2019-11-12 HISTORY — DX: Heart failure, unspecified: I50.9

## 2019-11-12 NOTE — ED Provider Notes (Signed)
[facility] ____________________________________________  Time seen: Approximately 7:08 PM  I have reviewed the triage vital signs and the nursing notes.   HISTORY  Chief Complaint Hypotension and Fatigue   HPI [place] Kenneth Watson is a 85 y.o. male past medical history of atrial fib, congestive heart failure, with ejection fraction of 35%, GERD, hypertension hyperlipidemia presenting for hypotension. Patient reports that for the last 2 days he has been feeling somewhat "lethargic "describing as feeling tired and more weak than normal. Reports his wife has been checking his blood pressure and his blood pressures at home were measuring 80s and the top number. Reports he does sometimes have some dizziness with position changes particularly, but reports this is not too atypical for him. Denies any chest pain, shortness of breath, paresthesias, vision changes, atypical extremity swelling, cough, congestion or fevers. Reports he has not been eating and drinking as much and has not had as much of an appetite. Denies aggravating or alleviating factors otherwise. No diarrhea or vomiting. No recent sickness.  Most recent cardiology visit notes reviewed with note that patient has permanent A. fib, anticoagulated on Eliquis, heart failure with reduced ejection fraction of 35%. At that time cardiology initiated 2.5 lisinopril new medication, and has continued on same dose of metoprolol.  Valera Castle, MD : PCP   Past Medical History:  Diagnosis Date  . Atrial fibrillation ([place])   . Cancer ([place]) 2006   Squamous cell carcicoma  . Cardiomyopathy ([place])   . Congestive heart failure (CHF) ([place])   . GERD (gastroesophageal reflux disease)   . Hyperlipidemia   . Hypertension   . Neuropathy     There are no problems to display for this patient.   Past Surgical History:  Procedure Laterality Date  . FRACTURE SURGERY  85 yrs old   elbow  . HERNIA REPAIR    . VASECTOMY       No  current facility-administered medications for this encounter.  Current Outpatient Medications:  .  ELIQUIS 5 MG TABS tablet, , Disp: , Rfl:  .  lisinopril (ZESTRIL) 2.5 MG tablet, Take by mouth., Disp: , Rfl:  .  metoprolol succinate (TOPROL-XL) 25 MG 24 hr tablet, Take 25 mg by mouth daily., Disp: , Rfl:  .  montelukast (SINGULAIR) 10 MG tablet, , Disp: , Rfl:   Allergies Septra [sulfamethoxazole-trimethoprim], Statins, and Sulfur  Family History  Problem Relation Age of Onset  . Heart attack Mother   . Uterine cancer Mother   . Kidney failure Father     Social History Social History   Tobacco Use  . Smoking status: Never Smoker  . Smokeless tobacco: Never Used  Substance Use Topics  . Alcohol use: No  . Drug use: No    Review of Systems Constitutional: No fever ENT: No sore throat. Cardiovascular: Denies chest pain. Respiratory: Denies shortness of breath. Gastrointestinal: No abdominal pain.  No nausea, no vomiting.  No diarrhea.   Genitourinary: Negative for dysuria. Musculoskeletal: Negative for back pain. Skin: Negative for rash. Neurological: Negative for focal weakness or numbness.   ____________________________________________   PHYSICAL EXAM:  VITAL SIGNS: ED Triage Vitals [11/12/19 1840]  Enc Vitals Group     BP (!) 87/70     Pulse Rate 93     Resp 18     Temp 98.2 F (36.8 C)     Temp Source Oral     SpO2 98 %     Weight 150 lb (68 kg)  Height 5\' 10"  (1.778 m)     Head Circumference      Peak Flow      Pain Score 0     Pain Loc      Pain Edu?      Excl. in [place]?     Constitutional: Alert and oriented. Well appearing and in no acute distress. Eyes: Conjunctivae are normal.  ENT      Head: Normocephalic and atraumatic. Cardiovascular: Irregularly irregular. good peripheral circulation. Respiratory: Normal respiratory effort without tachypnea nor retractions. Breath sounds are clear and equal bilaterally. No wheezes, rales,  rhonchi. Musculoskeletal:No lower extremity edema.  Neurologic:  Normal speech and language. No gross focal neurologic deficits are appreciated. Speech is normal. No gait instability. No paresthesias.  Skin:  Skin is warm, dry and intact. No rash noted. Psychiatric: Mood and affect are normal. Speech and behavior are normal. Patient exhibits appropriate insight and judgment   ___________________________________________   LABS (all labs ordered are listed, but only abnormal results are displayed)  Labs Reviewed - No data to display ____________________________________________  EKG  ED ECG REPORT I, Marylene Land, the attending provider, personally viewed and interpreted this ECG.   Date: 11/12/2019  EKG Time: 1911  Rate: 76  Rhythm: atrial fibrillatino  Axis: normal  Intervals:none  ST&T Change: none  RADIOLOGY  No results found. ____________________________________________   PROCEDURES Procedures   INITIAL IMPRESSION / ASSESSMENT AND PLAN / ED COURSE  Pertinent labs & imaging results that were available during my care of the patient were reviewed by me and considered in my medical decision making (see chart for details).  Patient alert and oriented, overall well-appearing. Patient with multiple comorbidities including CHF and decreased ejection fraction, with hypertensive and fatigue acute onset in 2 days. No focal neurological deficits. Discussed multiple differentials with patient, concern for dehydration. Patient hypertensive in urgent care. Counseled regarding patient will likely need to hold his lisinopril and speak with his primary care and cardiologist tomorrow prior to resuming. Recommend further evaluation and likely fluids slowly in emergency room at this time. Patient his wife states he will go directly to [facility].  ____________________________________________   FINAL CLINICAL IMPRESSION(S) / ED DIAGNOSES  Final diagnoses:  Hypotension,  unspecified hypotension type  Fatigue, unspecified type     ED Discharge Orders    None       Note: This dictation was prepared with Dragon dictation along with smaller phrase technology. Any transcriptional errors that result from this process are unintentional.         Marylene Land, NP 11/12/19 1931

## 2019-11-12 NOTE — Discharge Instructions (Addendum)
Go directly to emergency room as discussed.  °

## 2019-11-12 NOTE — ED Triage Notes (Signed)
Patient in today c/o hypotension and weakness since yesterday.

## 2020-02-18 ENCOUNTER — Other Ambulatory Visit: Payer: Self-pay | Admitting: Orthopedic Surgery

## 2020-02-18 DIAGNOSIS — G8929 Other chronic pain: Secondary | ICD-10-CM

## 2020-02-18 DIAGNOSIS — M25312 Other instability, left shoulder: Secondary | ICD-10-CM

## 2020-02-18 DIAGNOSIS — S46002A Unspecified injury of muscle(s) and tendon(s) of the rotator cuff of left shoulder, initial encounter: Secondary | ICD-10-CM

## 2020-03-03 ENCOUNTER — Ambulatory Visit
Admission: RE | Admit: 2020-03-03 | Discharge: 2020-03-03 | Disposition: A | Discharge status: Home / Self Care | Payer: Medicare Other | Source: Ambulatory Visit | Attending: Orthopedic Surgery | Admitting: Orthopedic Surgery

## 2020-03-03 ENCOUNTER — Other Ambulatory Visit: Payer: Self-pay

## 2020-03-03 DIAGNOSIS — M25512 Pain in left shoulder: Secondary | ICD-10-CM | POA: Insufficient documentation

## 2020-03-03 DIAGNOSIS — M25312 Other instability, left shoulder: Secondary | ICD-10-CM | POA: Diagnosis present

## 2020-03-03 DIAGNOSIS — S46002A Unspecified injury of muscle(s) and tendon(s) of the rotator cuff of left shoulder, initial encounter: Secondary | ICD-10-CM

## 2020-03-03 DIAGNOSIS — G8929 Other chronic pain: Secondary | ICD-10-CM | POA: Diagnosis present

## 2020-10-21 ENCOUNTER — Other Ambulatory Visit: Payer: Self-pay | Admitting: Orthopedic Surgery

## 2020-10-21 DIAGNOSIS — M47812 Spondylosis without myelopathy or radiculopathy, cervical region: Secondary | ICD-10-CM

## 2020-10-28 ENCOUNTER — Other Ambulatory Visit: Payer: Self-pay

## 2020-10-28 ENCOUNTER — Ambulatory Visit
Admission: RE | Admit: 2020-10-28 | Discharge: 2020-10-28 | Disposition: A | Discharge status: Home / Self Care | Payer: Medicare Other | Source: Ambulatory Visit | Attending: Orthopedic Surgery | Admitting: Orthopedic Surgery

## 2020-10-28 DIAGNOSIS — M47812 Spondylosis without myelopathy or radiculopathy, cervical region: Secondary | ICD-10-CM | POA: Diagnosis present

## 2020-11-18 DIAGNOSIS — I340 Nonrheumatic mitral (valve) insufficiency: Secondary | ICD-10-CM | POA: Insufficient documentation

## 2020-11-25 ENCOUNTER — Other Ambulatory Visit: Payer: Self-pay | Admitting: Physical Medicine & Rehabilitation

## 2020-11-25 ENCOUNTER — Other Ambulatory Visit: Payer: Self-pay | Admitting: Internal Medicine

## 2020-11-25 DIAGNOSIS — M4802 Spinal stenosis, cervical region: Secondary | ICD-10-CM

## 2020-11-25 DIAGNOSIS — M542 Cervicalgia: Secondary | ICD-10-CM

## 2020-11-25 DIAGNOSIS — M5030 Other cervical disc degeneration, unspecified cervical region: Secondary | ICD-10-CM

## 2020-12-04 ENCOUNTER — Ambulatory Visit
Admission: RE | Admit: 2020-12-04 | Discharge: 2020-12-04 | Disposition: A | Discharge status: Home / Self Care | Payer: Medicare Other | Source: Ambulatory Visit | Attending: Physical Medicine & Rehabilitation | Admitting: Physical Medicine & Rehabilitation

## 2020-12-04 ENCOUNTER — Other Ambulatory Visit: Payer: Self-pay

## 2020-12-04 DIAGNOSIS — M542 Cervicalgia: Secondary | ICD-10-CM

## 2020-12-04 DIAGNOSIS — M4802 Spinal stenosis, cervical region: Secondary | ICD-10-CM

## 2020-12-04 DIAGNOSIS — M5030 Other cervical disc degeneration, unspecified cervical region: Secondary | ICD-10-CM

## 2020-12-04 MED ORDER — IOPAMIDOL (ISOVUE-M 300) INJECTION 61%
1.0000 mL | Freq: Once | INTRAMUSCULAR | Status: AC | PRN
  Administered 2020-12-04: 1 mL via EPIDURAL

## 2020-12-04 MED ORDER — TRIAMCINOLONE ACETONIDE 40 MG/ML IJ SUSP (RADIOLOGY)
60.0000 mg | Freq: Once | INTRAMUSCULAR | Status: AC
  Administered 2020-12-04: 60 mg via EPIDURAL

## 2020-12-04 NOTE — Discharge Instructions (Signed)

## 2020-12-16 DIAGNOSIS — I779 Disorder of arteries and arterioles, unspecified: Secondary | ICD-10-CM | POA: Insufficient documentation

## 2021-02-10 ENCOUNTER — Other Ambulatory Visit: Payer: Self-pay | Admitting: Physical Medicine & Rehabilitation

## 2021-02-10 DIAGNOSIS — M542 Cervicalgia: Secondary | ICD-10-CM

## 2021-02-15 ENCOUNTER — Ambulatory Visit
Admission: RE | Admit: 2021-02-15 | Discharge: 2021-02-15 | Disposition: A | Discharge status: Home / Self Care | Payer: Medicare Other | Source: Ambulatory Visit | Attending: Physical Medicine & Rehabilitation | Admitting: Physical Medicine & Rehabilitation

## 2021-02-15 DIAGNOSIS — M542 Cervicalgia: Secondary | ICD-10-CM

## 2021-02-15 MED ORDER — TRIAMCINOLONE ACETONIDE 40 MG/ML IJ SUSP (RADIOLOGY)
60.0000 mg | Freq: Once | INTRAMUSCULAR | Status: AC
  Administered 2021-02-15: 60 mg via EPIDURAL

## 2021-02-15 MED ORDER — IOPAMIDOL (ISOVUE-M 300) INJECTION 61%
1.0000 mL | Freq: Once | INTRAMUSCULAR | Status: AC | PRN
  Administered 2021-02-15: 1 mL via EPIDURAL

## 2021-02-15 NOTE — Discharge Instructions (Signed)
Post Procedure Spinal Discharge Instruction Sheet  1. You may resume a regular diet and any medications that you routinely take (including pain medications).  2. No driving day of procedure.  3. Light activity throughout the rest of the day.  Do not do any strenuous work, exercise, bending or lifting.  The day following the procedure, you can resume normal physical activity but you should refrain from exercising or physical therapy for at least three days thereafter.   Common Side Effects:   Headaches- take your usual medications as directed by your physician.  Increase your fluid intake.  Caffeinated beverages may be helpful.  Lie flat in bed until your headache resolves.   Restlessness or inability to sleep- you may have trouble sleeping for the next few days.  Ask your referring physician if you need any medication for sleep.   Facial flushing or redness- should subside within a few days.   Increased pain- a temporary increase in pain a day or two following your procedure is not unusual.  Take your pain medication as prescribed by your referring physician.   Leg cramps  Please contact our office at [phone] for the following symptoms:  Fever greater than 100 degrees.  Headaches unresolved with medication after 2-3 days.  Increased swelling, pain, or redness at injection site.  MAY TAKE YOUR ELIQUIS IN 24 HOURS WHICH IS TOMORROW ON 02/16/21 @ 10 AM

## 2021-04-20 ENCOUNTER — Other Ambulatory Visit: Payer: Self-pay | Admitting: Physical Medicine & Rehabilitation

## 2021-04-20 DIAGNOSIS — M542 Cervicalgia: Secondary | ICD-10-CM

## 2021-05-14 ENCOUNTER — Other Ambulatory Visit: Payer: Self-pay

## 2021-05-14 ENCOUNTER — Ambulatory Visit
Admission: RE | Admit: 2021-05-14 | Discharge: 2021-05-14 | Disposition: A | Discharge status: Home / Self Care | Payer: Medicare Other | Source: Ambulatory Visit | Attending: Physical Medicine & Rehabilitation | Admitting: Physical Medicine & Rehabilitation

## 2021-05-14 DIAGNOSIS — M542 Cervicalgia: Secondary | ICD-10-CM

## 2021-05-14 MED ORDER — IOPAMIDOL (ISOVUE-M 300) INJECTION 61%
1.0000 mL | Freq: Once | INTRAMUSCULAR | Status: AC | PRN
  Administered 2021-05-14: 1 mL via EPIDURAL

## 2021-05-14 MED ORDER — TRIAMCINOLONE ACETONIDE 40 MG/ML IJ SUSP (RADIOLOGY)
60.0000 mg | Freq: Once | INTRAMUSCULAR | Status: AC
  Administered 2021-05-14: 60 mg via EPIDURAL

## 2021-05-14 NOTE — Discharge Instructions (Addendum)

## 2021-07-20 ENCOUNTER — Other Ambulatory Visit
Admission: RE | Admit: 2021-07-20 | Discharge: 2021-07-20 | Disposition: A | Discharge status: Home / Self Care | Payer: Medicare Other | Attending: Urology | Admitting: Urology

## 2021-07-20 ENCOUNTER — Other Ambulatory Visit: Payer: Self-pay | Admitting: *Deleted

## 2021-07-20 ENCOUNTER — Encounter: Payer: Self-pay | Admitting: Urology

## 2021-07-20 ENCOUNTER — Ambulatory Visit (INDEPENDENT_AMBULATORY_CARE_PROVIDER_SITE_OTHER): Payer: Medicare Other | Admitting: Urology

## 2021-07-20 ENCOUNTER — Other Ambulatory Visit: Payer: Self-pay

## 2021-07-20 VITALS — BP 136/85 | HR 86 | Ht 70.0 in | Wt 165.0 lb

## 2021-07-20 DIAGNOSIS — N401 Enlarged prostate with lower urinary tract symptoms: Secondary | ICD-10-CM | POA: Diagnosis not present

## 2021-07-20 DIAGNOSIS — N138 Other obstructive and reflux uropathy: Secondary | ICD-10-CM | POA: Diagnosis not present

## 2021-07-20 DIAGNOSIS — N3944 Nocturnal enuresis: Secondary | ICD-10-CM

## 2021-07-20 LAB — URINALYSIS, COMPLETE (UACMP) WITH MICROSCOPIC
Bilirubin Urine: NEGATIVE
Glucose, UA: NEGATIVE mg/dL
Hgb urine dipstick: NEGATIVE
Ketones, ur: NEGATIVE mg/dL
Leukocytes,Ua: NEGATIVE
Nitrite: NEGATIVE
Protein, ur: NEGATIVE mg/dL
Specific Gravity, Urine: 1.02 (ref 1.005–1.030)
WBC, UA: NONE SEEN WBC/hpf (ref 0–5)
pH: 6 (ref 5.0–8.0)

## 2021-07-20 LAB — BLADDER SCAN AMB NON-IMAGING

## 2021-07-20 NOTE — Patient Instructions (Signed)
Minimize fluids 3 to 4 hours prior to bedtime, and urinate right before going to bed.  Wear compression socks to help with overnight urination.  Elevate the legs in the mid afternoon to also help with the lower leg swelling.  If your cardiologist prescribes a diuretic like furosemide, take this in the morning or early afternoon to prevent overnight urination.  Your urinalysis was completely normal today, and you are emptying your bladder completely.

## 2021-07-20 NOTE — Progress Notes (Signed)
   07/20/21 1:30 PM   Kenneth Watson 1931/10/11 [id number]  CC: Nocturnal enuresis  HPI: 87 year old male with 2 episodes of nocturnal enuresis over the last 6 months where he woke up with the sheets being wet.  He was also taking melatonin during that time, but has since discontinued that medication.  He denies any urinary problems during the day, and has nocturia 3 times per night over the last 10+ years that is minimally bothersome.  He had a TURP about 20 years ago and reports that this resolved most of his urinary symptoms.  Urinalysis today is completely benign, and PVR is normal at 0 mL.  He does have some significant lower extremity swelling that is worse at the end of the day.  He denies any gross hematuria, dysuria, or UTIs.   PMH: Past Medical History:  Diagnosis Date   Atrial fibrillation ([place])    Cancer ([place]) 2006   Squamous cell carcicoma   Cardiomyopathy ([place])    Congestive heart failure (CHF) ([facility])    GERD (gastroesophageal reflux disease)    Hyperlipidemia    Hypertension    Neuropathy     Surgical History: Past Surgical History:  Procedure Laterality Date   FRACTURE SURGERY  87 yrs old   elbow   HERNIA REPAIR     VASECTOMY        Family History: Family History  Problem Relation Age of Onset   Heart attack Mother    Uterine cancer Mother    Kidney failure Father     Social History:  reports that he has never smoked. He has never used smokeless tobacco. He reports that he does not drink alcohol and does not use drugs.  Physical Exam: BP 136/85   Pulse 86   Ht '5\' 10"'$  (1.778 m)   Wt 165 lb (74.8 kg)   BMI 23.68 kg/m    Constitutional:  Alert and oriented, No acute distress. Cardiovascular: No clubbing, cyanosis, or edema. Respiratory: Normal respiratory effort, no increased work of breathing. GI: Abdomen is soft, nontender, nondistended, no abdominal masses   Laboratory Data: Reviewed, see HPI  Assessment & Plan:   87 year old male with  distant history of TURP and no urinary symptoms during the day with 2 isolated episodes of nocturnal enuresis in the last 6 months.  May have been related to taking melatonin, and he has since discontinued that medication.  Urinalysis today is completely benign, and PVR is normal.  He has significant lower extremity edema in the evenings, and we discussed behavioral strategies like compression stockings, minimizing fluids 3 to 4 hours before bedtime, and voiding prior to bed.  He also has an upcoming cardiology appointment to consider diuretic for his lower extremity edema.  Behavioral strategies discussed at length, return precautions discussed RTC 6 months with PVR, likely can follow-up as needed if doing well at that time   Nickolas Madrid, MD 07/20/2021  [facility] [address], [place], [place] [address] [phone]

## 2021-08-18 DIAGNOSIS — Z85828 Personal history of other malignant neoplasm of skin: Secondary | ICD-10-CM | POA: Insufficient documentation

## 2021-10-24 IMAGING — XA DG INJECT/[PERSON_NAME] INC NEEDLE/CATH/PLC EPI/CERV/THOR W/IMG
2 series · 2 of 2 positions shown · non-contrast
Comparison: none

CLINICAL DATA: Cervicalgia. Bilateral neck pain extending into the
occiput, right greater than left.

[Series 1: ortho adipose · 1 of 1 slices shown (1 of 2)]
[im 1/1]
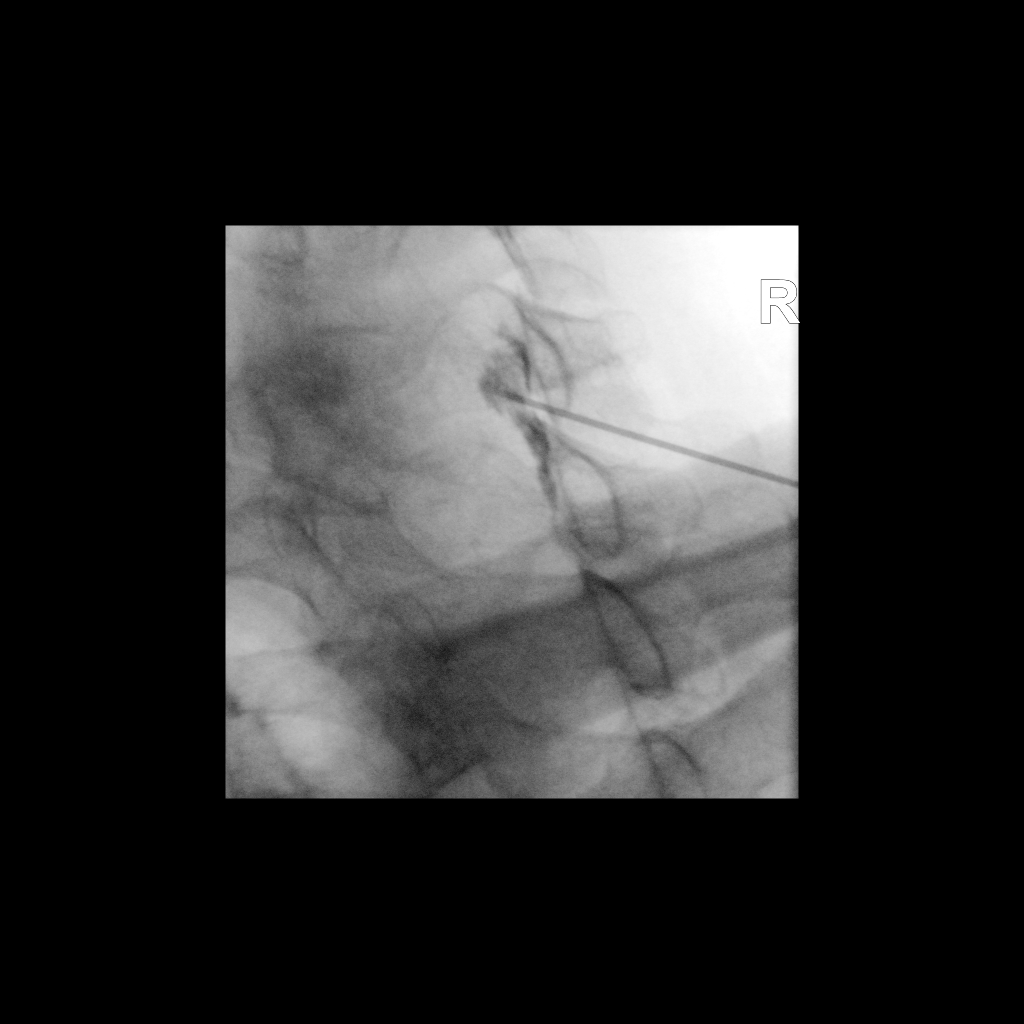

[Series 2: ortho adipose · 1 of 1 slices shown (2 of 2)]
[im 1/1]
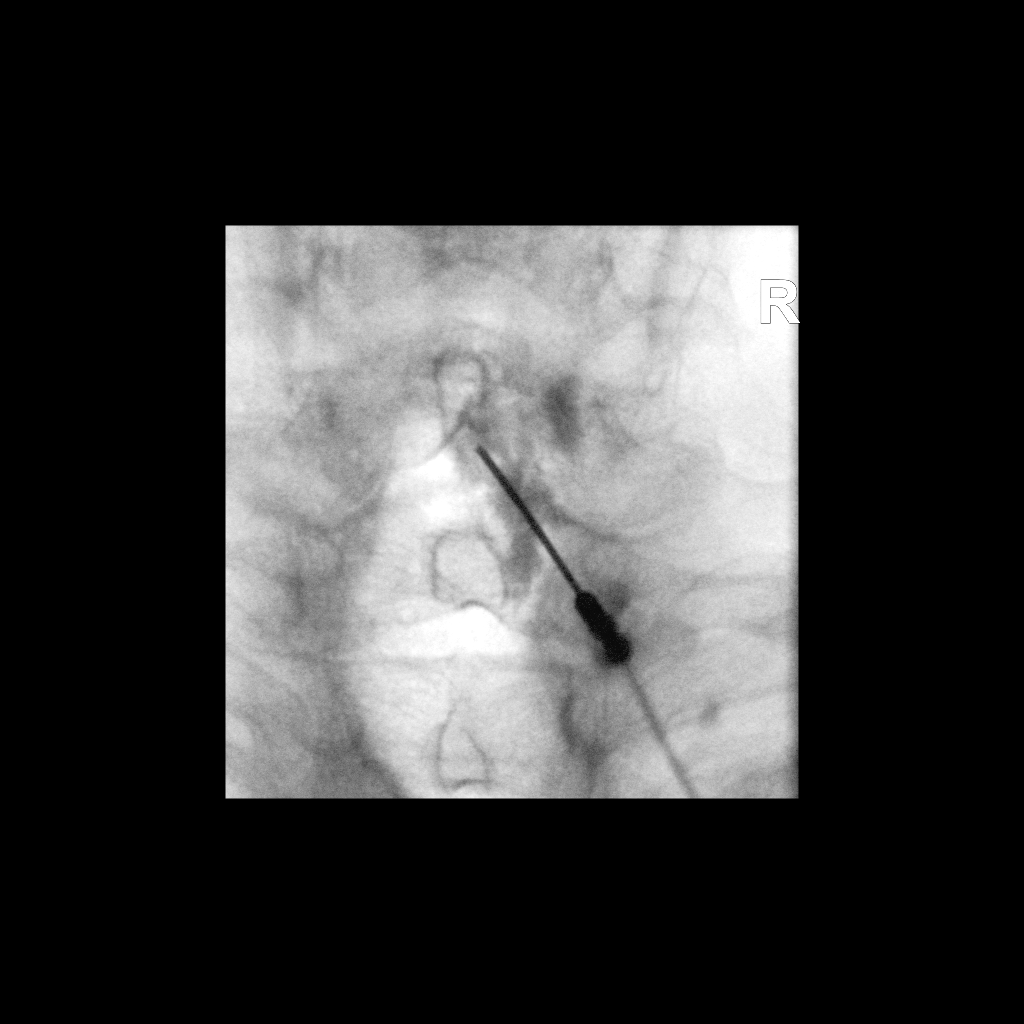

[2 of 2 positions shown; findings below may reference images not displayed]

FLUOROSCOPY TIME:  Radiation Exposure Index (as provided by the
fluoroscopic device): 7.81 uGy*m2

PROCEDURE:
CERVICAL EPIDURAL INJECTION

An interlaminar approach was performed on the right at C7-T1. A 20
gauge epidural needle was advanced using loss-of-resistance
technique.

DIAGNOSTIC EPIDURAL INJECTION

Injection of Isovue-M 300 shows a good epidural pattern with spread
above and below the level of needle placement, primarily on the
right. No vascular opacification is seen. THERAPEUTIC

EPIDURAL INJECTION

1.5 ml of Kenalog 40 mixed with 1 ml of 1% Lidocaine and 2 ml of
normal saline were then instilled. The procedure was well-tolerated,
and the patient was discharged thirty minutes following the
injection in good condition.
IMPRESSION: Technically successful first epidural injection on the right at
C7-T1.

## 2021-10-25 DIAGNOSIS — M47812 Spondylosis without myelopathy or radiculopathy, cervical region: Secondary | ICD-10-CM | POA: Insufficient documentation

## 2022-01-18 ENCOUNTER — Ambulatory Visit: Payer: Medicare Other | Admitting: Urology

## 2022-01-25 ENCOUNTER — Ambulatory Visit (INDEPENDENT_AMBULATORY_CARE_PROVIDER_SITE_OTHER): Payer: Medicare Other | Admitting: Urology

## 2022-01-25 ENCOUNTER — Encounter: Payer: Self-pay | Admitting: Urology

## 2022-01-25 ENCOUNTER — Other Ambulatory Visit: Payer: Self-pay

## 2022-01-25 VITALS — BP 123/84 | HR 84 | Ht 70.0 in | Wt 163.0 lb

## 2022-01-25 DIAGNOSIS — N401 Enlarged prostate with lower urinary tract symptoms: Secondary | ICD-10-CM

## 2022-01-25 DIAGNOSIS — N138 Other obstructive and reflux uropathy: Secondary | ICD-10-CM | POA: Diagnosis not present

## 2022-01-25 DIAGNOSIS — N3944 Nocturnal enuresis: Secondary | ICD-10-CM | POA: Diagnosis not present

## 2022-01-25 LAB — BLADDER SCAN AMB NON-IMAGING

## 2022-01-25 MED ORDER — TAMSULOSIN HCL 0.4 MG PO CAPS: 0.4000 mg | ORAL_CAPSULE | Freq: Every day | ORAL | 11 refills | Status: DC

## 2022-01-25 NOTE — Progress Notes (Signed)
? ?  01/25/2022 ?11:17 AM  ? ?Leslie Andrea ?03-Sep-1931 ?[id number] ? ?Reason for visit: Follow up BPH, nocturia ? ?HPI: ?88 year old male with history of BPH and TURP about 20 years ago that resolved most of his urinary symptoms.  I last saw him in August 2022 when he had had 2 episodes over the last 6 months of incontinence overnight.  These were associated with melatonin and have resolved after discontinuing that medication.  His primary issue is nocturia 2-3 times at night.  At our last visit we had also discussed compression stockings in the evening, low-salt diet, and minimizing fluids before bedtime.  At this point he is interested in a trial of a medication for his persistent nocturia.  PVR is mildly elevated today at 160 mL.  Risks and benefits of Flomax discussed at length, we also discussed considering cystoscopy in the future to evaluate for regrowth of adenoma or bladder neck contracture, but he would like to hold off at this time. ? ?Trial of Flomax 0.4 mg nightly for BPH ?RTC 3 months symptom check, consider cystoscopy if persistently bothersome symptoms ? ? ?Billey Co, MD ? ?[facility] ?[address], [address] ?[place], [place] [address] ?[phone] ? ? ?

## 2022-01-25 NOTE — Patient Instructions (Signed)

## 2022-03-09 DIAGNOSIS — I472 Ventricular tachycardia, unspecified: Secondary | ICD-10-CM | POA: Insufficient documentation

## 2022-03-26 ENCOUNTER — Ambulatory Visit (INDEPENDENT_AMBULATORY_CARE_PROVIDER_SITE_OTHER): Payer: Medicare Other

## 2022-03-26 ENCOUNTER — Other Ambulatory Visit: Payer: Self-pay

## 2022-03-26 ENCOUNTER — Ambulatory Visit
Admission: EM | Admit: 2022-03-26 | Discharge: 2022-03-26 | Disposition: A | Discharge status: Home / Self Care | Payer: Medicare Other

## 2022-03-26 DIAGNOSIS — R059 Cough, unspecified: Secondary | ICD-10-CM

## 2022-03-26 DIAGNOSIS — J069 Acute upper respiratory infection, unspecified: Secondary | ICD-10-CM

## 2022-03-26 DIAGNOSIS — IMO0002 Reserved for concepts with insufficient information to code with codable children: Secondary | ICD-10-CM | POA: Insufficient documentation

## 2022-03-26 MED ORDER — BENZONATATE 100 MG PO CAPS: 100.0000 mg | ORAL_CAPSULE | Freq: Three times a day (TID) | ORAL | 0 refills | Status: DC

## 2022-03-26 MED ORDER — AMOXICILLIN 875 MG PO TABS: 875.0000 mg | ORAL_TABLET | Freq: Two times a day (BID) | ORAL | 0 refills | Status: AC

## 2022-03-26 NOTE — ED Triage Notes (Signed)
Patient is here for "Cough & Congestion" for about 10 days now. "Coughing so much it hurts my stomach, a lot of post nasal drip". No fever. No sob. No nausea/vomiting.  ?

## 2022-03-26 NOTE — ED Provider Notes (Signed)
?Kenneth Watson ? ? ? ?CSN: [id number] ?Arrival date & time: 03/26/22  3546 ? ? ?  ? ?History   ?Chief Complaint ?Chief Complaint  ?Patient presents with  ? Cough  ? ? ?HPI ?Kenneth Watson is a 88 y.o. male presenting with cough for 10 days.  History CHF, A-fib.  Describes hacking cough for 10 days, productive of yellow and green sputum.  He denies change in his baseline dyspnea.  States he has coughed so much that his abdominal muscles hurt.  Also with nasal congestion and postnasal drip.  Denies fever/chills, chest pain, new dyspnea, nausea, vomiting.  Has attempted over-the-counter Mucinex with some relief. ? ?HPI ? ?Past Medical History:  ?Diagnosis Date  ? Atrial fibrillation ([place])   ? Cancer [facility]) 2006  ? Squamous cell carcicoma  ? Cardiomyopathy ([place])   ? Congestive heart failure (CHF) ([place])   ? GERD (gastroesophageal reflux disease)   ? Hyperlipidemia   ? Hypertension   ? Neuropathy   ? ? ?Patient Active Problem List  ? Diagnosis Date Noted  ? Squamous cell carcinoma 03/26/2022  ? V-tach ([place]) 03/09/2022  ? Cervical facet syndrome 10/25/2021  ? Personal history of other malignant neoplasm of skin 08/18/2021  ? Bilateral carotid artery disease ([place]) 12/16/2020  ? Moderate mitral regurgitation 11/18/2020  ? Chronic systolic CHF (congestive heart failure), NYHA class 2 ([place]) 11/27/2017  ? Bilateral hearing loss 10/25/2017  ? Bilateral impacted cerumen 10/25/2017  ? Chronic hand pain, right 04/04/2017  ? Primary osteoarthritis of right hand 04/04/2017  ? Essential hypertension 03/23/2017  ? Gastroesophageal reflux disease 03/23/2017  ? Nasal congestion with rhinorrhea 03/23/2017  ? ED (erectile dysfunction) of organic origin 06/28/2016  ? Frequent PVCs 05/19/2015  ? Moderate aortic valve insufficiency 05/12/2015  ? Moderate tricuspid insufficiency 01/07/2015  ? Chronic a-fib ([place]) 09/03/2014  ? Dermatochalasis 02/25/2014  ? Mechanical ptosis 01/21/2014  ? Pseudophakia of both eyes 04/12/2013  ? Bicipital  tenosynovitis 12/28/2012  ? Rotator cuff tear 11/02/2012  ? Hearing loss, sensorineural, asymmetrical 10/24/2012  ? BPH (benign prostatic hyperplasia) 09/25/2012  ? Shoulder sprain 09/17/2012  ? Hyperlipidemia, mixed 06/03/2011  ? Low back pain 06/03/2011  ? Nocturia 06/03/2011  ? ? ?Past Surgical History:  ?Procedure Laterality Date  ? FRACTURE SURGERY  88 yrs old  ? elbow  ? HERNIA REPAIR    ? VASECTOMY    ? ? ? ? ? ?Home Medications   ? ?Prior to Admission medications   ?Medication Sig Start Date End Date Taking? Authorizing Provider  ?amoxicillin (AMOXIL) 875 MG tablet Take 1 tablet (875 mg total) by mouth 2 (two) times daily for 7 days. 03/26/22 04/02/22 Yes Hazel Sams, PA-C  ?benzonatate (TESSALON) 100 MG capsule Take 1 capsule (100 mg total) by mouth every 8 (eight) hours. 03/26/22  Yes Hazel Sams, PA-C  ?ELIQUIS 5 MG TABS tablet  08/27/18  Yes [provider]  ?metoprolol succinate (TOPROL-XL) 25 MG 24 hr tablet Take 25 mg by mouth daily. 03/28/18  Yes [provider]  ?montelukast (SINGULAIR) 10 MG tablet  08/10/18  Yes [provider]  ?tamsulosin (FLOMAX) 0.4 MG CAPS capsule Take 1 capsule (0.4 mg total) by mouth daily. 01/25/22  Yes Billey Co, MD  ?ammonium lactate (AMLACTIN) 12 % cream Apply topically. 02/24/22   [provider]  ?Ascorbic Acid (VITAMIN C) 100 MG tablet  12/21/21   [provider]  ?lisinopril (ZESTRIL) 2.5 MG tablet Take by mouth. 10/08/19  [provider]  ?terbinafine (LAMISIL) 250 MG tablet Take 250 mg by mouth daily. 07/03/21   [provider]  ?Vitamin D, Ergocalciferol, (DRISDOL) 1.25 MG (50000 UNIT) CAPS capsule  12/21/21   [provider]  ? ? ?Family History ?Family History  ?Problem Relation Age of Onset  ? Heart attack Mother   ? Uterine cancer Mother   ? Kidney failure Father   ? ? ?Social History ?Social History  ? ?Tobacco Use  ? Smoking status: Never  ?  Passive exposure: Never  ? Smokeless  tobacco: Never  ?Vaping Use  ? Vaping Use: Never used  ?Substance Use Topics  ? Alcohol use: No  ? Drug use: No  ? ? ? ?Allergies   ?Elemental sulfur, Septra [sulfamethoxazole-trimethoprim], and Statins ? ? ?Review of Systems ?Review of Systems  ?Constitutional:  Negative for appetite change, chills and fever.  ?HENT:  Positive for congestion. Negative for ear pain, rhinorrhea, sinus pressure, sinus pain and sore throat.   ?Eyes:  Negative for redness and visual disturbance.  ?Respiratory:  Positive for cough. Negative for chest tightness, shortness of breath and wheezing.   ?Cardiovascular:  Negative for chest pain and palpitations.  ?Gastrointestinal:  Negative for abdominal pain, constipation, diarrhea, nausea and vomiting.  ?Genitourinary:  Negative for dysuria, frequency and urgency.  ?Musculoskeletal:  Negative for myalgias.  ?Neurological:  Negative for dizziness, weakness and headaches.  ?Psychiatric/Behavioral:  Negative for confusion.   ?All other systems reviewed and are negative. ? ? ?Physical Exam ?Triage Vital Signs ?ED Triage Vitals  ?Enc Vitals Group  ?   BP 03/26/22 0831 107/74  ?   Pulse Rate 03/26/22 0831 79  ?   Resp 03/26/22 0831 20  ?   Temp 03/26/22 0831 98.1 ?F (36.7 ?C)  ?   Temp Source 03/26/22 0831 Oral  ?   SpO2 03/26/22 0831 98 %  ?   Weight 03/26/22 0829 169 lb 1.6 oz (76.7 kg)  ?   Height 03/26/22 0829 '5\' 10"'$  (1.778 m)  ?   Head Circumference --   ?   Peak Flow --   ?   Pain Score 03/26/22 0827 0  ?   Pain Loc --   ?   Pain Edu? --   ?   Excl. in [address]? --   ? ?No data found. ? ?Updated Vital Signs ?BP 107/74 (BP Location: Left Arm)   Pulse 79   Temp 98.1 ?F (36.7 ?C) (Oral)   Resp 20   Ht '5\' 10"'$  (1.778 m)   Wt 169 lb 1.6 oz (76.7 kg)   SpO2 98%   BMI 24.26 kg/m?  ? ?Visual Acuity ?Right Eye Distance:   ?Left Eye Distance:   ?Bilateral Distance:   ? ?Right Eye Near:   ?Left Eye Near:    ?Bilateral Near:    ? ?Physical Exam ?Vitals reviewed.  ?Constitutional:   ?   General: He is  not in acute distress. ?   Appearance: Normal appearance. He is not ill-appearing.  ?   Comments: Hard of hearing  ?HENT:  ?   Head: Normocephalic and atraumatic.  ?   Right Ear: Tympanic membrane, ear canal and external ear normal. No tenderness. No middle ear effusion. There is no impacted cerumen. Tympanic membrane is not perforated, erythematous, retracted or bulging.  ?   Left Ear: Tympanic membrane, ear canal and external ear normal. No tenderness.  No middle ear effusion. There is no impacted cerumen. Tympanic membrane is  not perforated, erythematous, retracted or bulging.  ?   Nose: Nose normal. No congestion.  ?   Mouth/Throat:  ?   Mouth: Mucous membranes are moist.  ?   Pharynx: Uvula midline. No oropharyngeal exudate or posterior oropharyngeal erythema.  ?Eyes:  ?   Extraocular Movements: Extraocular movements intact.  ?   Pupils: Pupils are equal, round, and reactive to light.  ?Cardiovascular:  ?   Rate and Rhythm: Normal rate and regular rhythm.  ?   Heart sounds: Normal heart sounds.  ?Pulmonary:  ?   Effort: Pulmonary effort is normal.  ?   Breath sounds: Decreased breath sounds and rhonchi present. No wheezing or rales.  ?   Comments: Right-sided rhonchi and left-sided decreased breath sounds. ?Abdominal:  ?   Palpations: Abdomen is soft.  ?   Tenderness: There is no abdominal tenderness. There is no guarding or rebound.  ?Lymphadenopathy:  ?   Cervical: No cervical adenopathy.  ?   Right cervical: No superficial cervical adenopathy. ?   Left cervical: No superficial cervical adenopathy.  ?Neurological:  ?   General: No focal deficit present.  ?   Mental Status: He is alert and oriented to person, place, and time.  ?Psychiatric:     ?   Mood and Affect: Mood normal.     ?   Behavior: Behavior normal.     ?   Thought Content: Thought content normal.     ?   Judgment: Judgment normal.  ? ? ? ?UC Treatments / Results  ?Labs ?(all labs ordered are listed, but only abnormal results are displayed) ?Labs  Reviewed - No data to display ? ?EKG ? ? ?Radiology ?DG Chest 2 View ? ?Result Date: 03/26/2022 ?CLINICAL DATA:  Cough EXAM: CHEST - 2 VIEW COMPARISON:  Chest x-ray dated Mar 25, 2019 FINDINGS: Unchanged cardiomegaly. El

## 2022-03-26 NOTE — Discharge Instructions (Addendum)
-  Your chest x-ray looked good.  You do not have pneumonia.  We will treat with an antibiotic to make sure that this does not progress into pneumonia. ?-Amoxicillin twice daily x7 days. Take with food if you have a sensitive stomach.  ?-Tessalon (Benzonatate) as needed for cough. Take one pill up to 3x daily (every 8 hours) ?-You can continue over-the-counter medications if they are helping. ?-Follow-up with primary care provider if symptoms persist, or new symptoms like coughing up red or dark sputum, new chest pain, new/worsening shortness of breath. ? ?

## 2022-04-12 ENCOUNTER — Ambulatory Visit
Admission: EM | Admit: 2022-04-12 | Discharge: 2022-04-12 | Disposition: A | Discharge status: Home / Self Care | Payer: Medicare Other | Attending: Physician Assistant | Admitting: Physician Assistant

## 2022-04-12 DIAGNOSIS — R21 Rash and other nonspecific skin eruption: Secondary | ICD-10-CM

## 2022-04-12 MED ORDER — TRIAMCINOLONE ACETONIDE 0.1 % EX OINT: 1.0000 "application " | TOPICAL_OINTMENT | Freq: Two times a day (BID) | CUTANEOUS | 0 refills | Status: DC

## 2022-04-12 NOTE — ED Triage Notes (Signed)
Patient c.o rash -- started about 4 days ago.   the only thing new he has used was a cream from his dermatologist.  The rash is located on the left side of his abdominal area.

## 2022-04-12 NOTE — Discharge Instructions (Addendum)
-  Rash looks to be consistent with allergic type reaction to the AmLactin that you applied.  Discontinue use of AmLactin.  I have sent a topical corticosteroid for this rash.  Talk to your dermatologist about a different treatment plan for your seborrheic keratoses - You can take Benadryl for itching but may be reserved for nighttime if it makes you sleepy.  Nondrowsy Claritin during the day

## 2022-04-12 NOTE — ED Provider Notes (Signed)
[facility]    CSN: [id number] Arrival date & time: 04/12/22  3474      History   Chief Complaint No chief complaint on file.   HPI Kenneth Watson is a 88 y.o. male presenting for rash of left abdomen.  He says it started after using AmLactin cream for his seborrheic keratoses as prescribed by his dermatologist.  Patient says the rash is itchy but not painful.  He has tried multiple different ointments and creams he has had at home but he is not sure the names of these ointments and creams.  In any case he has not had any relief.  He says the rash has been present for about a week and it has not improved or worsened.  He has no rash anywhere else.  He otherwise feels fine.  He has not taken any new medications at all.  HPI  Past Medical History:  Diagnosis Date   Atrial fibrillation ([place])    Cancer ([place]) 2006   Squamous cell carcicoma   Cardiomyopathy ([place])    Congestive heart failure (CHF) ([facility])    GERD (gastroesophageal reflux disease)    Hyperlipidemia    Hypertension    Neuropathy     Patient Active Problem List   Diagnosis Date Noted   Squamous cell carcinoma 03/26/2022   V-tach ([place]) 03/09/2022   Cervical facet syndrome 10/25/2021   Personal history of other malignant neoplasm of skin 08/18/2021   Bilateral carotid artery disease ([place]) 12/16/2020   Moderate mitral regurgitation 25/95/6387   Chronic systolic CHF (congestive heart failure), NYHA class 2 ([place]) 11/27/2017   Bilateral hearing loss 10/25/2017   Bilateral impacted cerumen 10/25/2017   Chronic hand pain, right 04/04/2017   Primary osteoarthritis of right hand 04/04/2017   Essential hypertension 03/23/2017   Gastroesophageal reflux disease 03/23/2017   Nasal congestion with rhinorrhea 03/23/2017   ED (erectile dysfunction) of organic origin 06/28/2016   Frequent PVCs 05/19/2015   Moderate aortic valve insufficiency 05/12/2015   Moderate tricuspid insufficiency 01/07/2015   Chronic a-fib  ([place]) 09/03/2014   Dermatochalasis 02/25/2014   Mechanical ptosis 01/21/2014   Pseudophakia of both eyes 04/12/2013   Bicipital tenosynovitis 12/28/2012   Rotator cuff tear 11/02/2012   Hearing loss, sensorineural, asymmetrical 10/24/2012   BPH (benign prostatic hyperplasia) 09/25/2012   Shoulder sprain 09/17/2012   Hyperlipidemia, mixed 06/03/2011   Low back pain 06/03/2011   Nocturia 06/03/2011    Past Surgical History:  Procedure Laterality Date   FRACTURE SURGERY  88 yrs old   elbow   [place] Medications    Prior to Admission medications   Medication Sig Start Date End Date Taking? Authorizing Provider  ammonium lactate (AMLACTIN) 12 % cream Apply topically. 02/24/22  Yes [provider]  Ascorbic Acid (VITAMIN C) 100 MG tablet  12/21/21  Yes [provider]  benzonatate (TESSALON) 100 MG capsule Take 1 capsule (100 mg total) by mouth every 8 (eight) hours. 03/26/22  Yes Hazel Sams, PA-C  ELIQUIS 5 MG TABS tablet  08/27/18  Yes [provider]  lisinopril (ZESTRIL) 2.5 MG tablet Take by mouth. 10/08/19  Yes [provider]  metoprolol succinate (TOPROL-XL) 25 MG 24 hr tablet Take 25 mg by mouth daily. 03/28/18  Yes [provider]  montelukast (SINGULAIR) 10 MG tablet  08/10/18  Yes [provider]  tamsulosin (FLOMAX) 0.4 MG CAPS capsule Take 1 capsule (  0.4 mg total) by mouth daily. 01/25/22  Yes Billey Co, MD  triamcinolone ointment (KENALOG) 0.1 % Apply 1 application. topically 2 (two) times daily. 04/12/22  Yes Laurene Footman B, PA-C  terbinafine (LAMISIL) 250 MG tablet Take 250 mg by mouth daily. 07/03/21   [provider]  Vitamin D, Ergocalciferol, (DRISDOL) 1.25 MG (50000 UNIT) CAPS capsule  12/21/21   [provider]    Family History Family History  Problem Relation Age of Onset   Heart attack Mother    Uterine cancer Mother    Kidney failure Father      Social History Social History   Tobacco Use   Smoking status: Never    Passive exposure: Never   Smokeless tobacco: Never  Vaping Use   Vaping Use: Never used  Substance Use Topics   Alcohol use: No   Drug use: No     Allergies   Elemental sulfur, Septra [sulfamethoxazole-trimethoprim], and Statins   Review of Systems Review of Systems  Constitutional:  Negative for fatigue.  HENT:  Negative for facial swelling and trouble swallowing.   Respiratory:  Negative for chest tightness, shortness of breath and wheezing.   Gastrointestinal:  Negative for abdominal pain, nausea and vomiting.  Skin:  Positive for rash.  Neurological:  Negative for weakness.    Physical Exam Triage Vital Signs ED Triage Vitals  Enc Vitals Group     BP 04/12/22 0843 101/76     Pulse Rate 04/12/22 0843 77     Resp 04/12/22 0843 18     Temp 04/12/22 0843 98.1 F (36.7 C)     Temp Source 04/12/22 0843 Oral     SpO2 04/12/22 0843 97 %     Weight 04/12/22 0841 165 lb (74.8 kg)     Height 04/12/22 0841 '5\' 10"'$  (1.778 m)     Head Circumference --      Peak Flow --      Pain Score 04/12/22 0840 0     Pain Loc --      Pain Edu? --      Excl. in [place]? --    No data found.  Updated Vital Signs BP 101/76 (BP Location: Left Arm)   Pulse 77   Temp 98.1 F (36.7 C) (Oral)   Resp 18   Ht '5\' 10"'$  (1.778 m)   Wt 165 lb (74.8 kg)   SpO2 97%   BMI 23.68 kg/m   Physical Exam Vitals and nursing note reviewed.  Constitutional:      General: He is not in acute distress.    Appearance: Normal appearance. He is well-developed. He is not ill-appearing.  HENT:     Head: Normocephalic and atraumatic.  Eyes:     General: No scleral icterus.    Conjunctiva/sclera: Conjunctivae normal.  Cardiovascular:     Rate and Rhythm: Normal rate and regular rhythm.     Heart sounds: No murmur heard. Pulmonary:     Effort: Pulmonary effort is normal. No respiratory distress.     Breath sounds: Normal breath  sounds.  Abdominal:     Palpations: Abdomen is soft.     Tenderness: There is no abdominal tenderness.  Musculoskeletal:     Cervical back: Neck supple.  Skin:    General: Skin is warm and dry.     Capillary Refill: Capillary refill takes less than 2 seconds.     Findings: Rash present.  Neurological:     General: No focal deficit present.  Mental Status: He is alert. Mental status is at baseline.     Motor: No weakness.     Gait: Gait normal.  Psychiatric:        Mood and Affect: Mood normal.        Behavior: Behavior normal.        Thought Content: Thought content normal.      UC Treatments / Results  Labs (all labs ordered are listed, but only abnormal results are displayed) Labs Reviewed - No data to display  EKG   Radiology No results found.  Procedures Procedures (including critical care time)  Medications Ordered in UC Medications - No data to display  Initial Impression / Assessment and Plan / UC Course  I have reviewed the triage vital signs and the nursing notes.  Pertinent labs & imaging results that were available during my care of the patient were reviewed by me and considered in my medical decision making (see chart for details).  88 year old male presenting for rash of the left upper abdomen and lower chest.  It has been present for the past 1 week and has not worsened or improved.  It is itchy but not painful.  Image obtained of the rash.  It is nontender.  Erythematous papules and shallow ulcerations present.  May be consistent with dermatitis related to the AmLactin cream since he applied in this area.  He has already stopped taking it.  Advised starting triamcinolone ointment and reaching back out to his dermatologist about another treatment plan for his seborrheic keratoses.   Final Clinical Impressions(s) / UC Diagnoses   Final diagnoses:  Rash and nonspecific skin eruption     Discharge Instructions      -Rash looks to be consistent  with allergic type reaction to the AmLactin that you applied.  Discontinue use of AmLactin.  I have sent a topical corticosteroid for this rash.  Talk to your dermatologist about a different treatment plan for your seborrheic keratoses - She can take Benadryl for itching but may be reserved for nighttime if it makes you sleepy.  Nondrowsy Claritin during the day     ED Prescriptions     Medication Sig Dispense Auth. Provider   triamcinolone ointment (KENALOG) 0.1 % Apply 1 application. topically 2 (two) times daily. 30 g Gretta Cool      PDMP not reviewed this encounter.   Danton Clap, PA-C 04/12/22 [phone]

## 2022-05-03 ENCOUNTER — Ambulatory Visit: Payer: Medicare Other | Admitting: Urology

## 2022-05-10 ENCOUNTER — Encounter: Payer: Self-pay | Admitting: Urology

## 2022-05-10 ENCOUNTER — Ambulatory Visit (INDEPENDENT_AMBULATORY_CARE_PROVIDER_SITE_OTHER): Payer: Medicare Other | Admitting: Urology

## 2022-05-10 VITALS — BP 105/70 | HR 72 | Ht 69.0 in | Wt 161.0 lb

## 2022-05-10 DIAGNOSIS — N401 Enlarged prostate with lower urinary tract symptoms: Secondary | ICD-10-CM

## 2022-05-10 DIAGNOSIS — N138 Other obstructive and reflux uropathy: Secondary | ICD-10-CM | POA: Diagnosis not present

## 2022-05-10 DIAGNOSIS — R351 Nocturia: Secondary | ICD-10-CM | POA: Diagnosis not present

## 2022-05-10 LAB — BLADDER SCAN AMB NON-IMAGING

## 2022-05-10 NOTE — Progress Notes (Signed)
   05/10/2022 9:30 AM   Kenneth Watson 12-10-1930 [id number]  Reason for visit: Follow up BPH, nocturia  HPI: 88 year old male with history of BPH and TURP about 20 years ago that resolved most of his urinary symptoms.  He has a history of incontinence overnight that was associated with melatonin, and resolved after discontinuing that medication.  Primary issue is nocturia 2-3 times per night, and we have previously discussed behavioral strategies including compression stockings in the evening, low-salt diet, and minimizing fluids before bedtime.  At our last visit in March 2023 we also started Flomax to see if we can improve some of his urinary symptoms and nocturia, as well as for his mildly elevated PVR of 122m.  I had previously offered cystoscopy for further evaluation of regrowth of adenoma or bladder neck contracture, but he deferred.  He noticed improvement in his urinary stream during the day on the Flomax, and PVR is improved today to 049m  He did not notice any improvement in the overnight urination, and continues to have nocturia 2-3 times overnight.  We again reviewed normal expectations regarding nocturia, and behavioral strategies.  I also discussed he could try a over-the-counter saw palmetto-based prostate supplement to see if this improves his urinary symptoms.  We again discussed cystoscopy, and he would like to hold off at this time.  He will think about continuing the Flomax over the next few months Return precautions discussed RTC 1 year PVR Re-discuss cystoscopy if worsening symptoms   BrBilley CoMD  [place] [id number]

## 2022-09-15 ENCOUNTER — Ambulatory Visit
Admission: EM | Admit: 2022-09-15 | Discharge: 2022-09-15 | Disposition: A | Discharge status: Home / Self Care | Payer: Medicare Other | Attending: Emergency Medicine | Admitting: Emergency Medicine

## 2022-09-15 ENCOUNTER — Ambulatory Visit (INDEPENDENT_AMBULATORY_CARE_PROVIDER_SITE_OTHER): Payer: Medicare Other

## 2022-09-15 ENCOUNTER — Encounter: Payer: Self-pay | Admitting: Emergency Medicine

## 2022-09-15 DIAGNOSIS — R079 Chest pain, unspecified: Secondary | ICD-10-CM | POA: Diagnosis not present

## 2022-09-15 DIAGNOSIS — R0789 Other chest pain: Secondary | ICD-10-CM | POA: Diagnosis not present

## 2022-09-15 MED ORDER — BACLOFEN 10 MG PO TABS: 10.0000 mg | ORAL_TABLET | Freq: Three times a day (TID) | ORAL | 0 refills | Status: DC

## 2022-09-15 NOTE — Discharge Instructions (Addendum)
Your chest x-ray did not show the presence of any underlying infection or broken ribs to explain the pain you are experiencing under your ribs on the right.  I suspect that you have strained the tissues in between or pulled on some of the scar tissue from your previous surgery and that is what is causing the discomfort with movement and deep breathing.  I want you to take over-the-counter Tylenol according to the package instructions as needed for pain.  You can take the baclofen, 10 mg every 8 hours, to help with muscle pain in your chest wall.  You can also apply topical, over-the-counter, lidocaine patches to your chest wall to help with the discomfort.  Each patch can be in place for 8 hours.  Your EKG also showed a change compared to your most recent EKG in epic from 11/12/2019.  I recommend that you follow-up with your cardiologist, Dr. Nehemiah Massed.  I have messaged him through secure epic chat to advise him of the changes.  If you develop any chest pain, dizziness, increasing shortness of breath, increasing leg swelling, or fainting I recommend you go to the ER for evaluation.

## 2022-09-15 NOTE — ED Provider Notes (Signed)
[facility]    CSN: [id number] Arrival date & time: 09/15/22  1338      History   Chief Complaint Chief Complaint  Patient presents with   Abdominal Pain    HPI Kenneth Watson is a 88 y.o. male.   HPI  88 year old male here for evaluation of right chest wall pain.  Patient reports that he developed sharp pain under the ribs on the right side 2 days ago that came on suddenly and was not in the setting of any falls or injury.  He states that it was very intense at first and then it would subside.  Over the course the day it waxed and waned and he was able to get sleep that night.  When he woke up the next morning the pain was gone.  Today the pain woke him up at around 3 AM out of sleep.  It is now a constant dull ache but becomes sharp with certain movements and if he takes a deep breath.  He denies any fever, cough, shortness of breath, chest pain, dizziness, nausea, or vomiting.  He also denies falls or injuries.  Past Medical History:  Diagnosis Date   Atrial fibrillation ([place])    Cancer ([place]) 2006   Squamous cell carcicoma   Cardiomyopathy ([place])    Congestive heart failure (CHF) (HCC)    GERD (gastroesophageal reflux disease)    Hyperlipidemia    Hypertension    Neuropathy     Patient Active Problem List   Diagnosis Date Noted   Squamous cell carcinoma 03/26/2022   V-tach ([place]) 03/09/2022   Cervical facet syndrome 10/25/2021   Personal history of other malignant neoplasm of skin 08/18/2021   Bilateral carotid artery disease ([place]) 12/16/2020   Moderate mitral regurgitation 28/41/3244   Chronic systolic CHF (congestive heart failure), NYHA class 2 ([place]) 11/27/2017   Bilateral hearing loss 10/25/2017   Bilateral impacted cerumen 10/25/2017   Chronic hand pain, right 04/04/2017   Primary osteoarthritis of right hand 04/04/2017   Essential hypertension 03/23/2017   Gastroesophageal reflux disease 03/23/2017   Nasal congestion with rhinorrhea 03/23/2017    ED (erectile dysfunction) of organic origin 06/28/2016   Frequent PVCs 05/19/2015   Moderate aortic valve insufficiency 05/12/2015   Moderate tricuspid insufficiency 01/07/2015   Chronic a-fib ([place]) 09/03/2014   Dermatochalasis 02/25/2014   Mechanical ptosis 01/21/2014   Pseudophakia of both eyes 04/12/2013   Bicipital tenosynovitis 12/28/2012   Rotator cuff tear 11/02/2012   Hearing loss, sensorineural, asymmetrical 10/24/2012   BPH (benign prostatic hyperplasia) 09/25/2012   Shoulder sprain 09/17/2012   Hyperlipidemia, mixed 06/03/2011   Low back pain 06/03/2011   Nocturia 06/03/2011    Past Surgical History:  Procedure Laterality Date   FRACTURE SURGERY  88 yrs old   elbow   [place] Medications    Prior to Admission medications   Medication Sig Start Date End Date Taking? Authorizing Provider  baclofen (LIORESAL) 10 MG tablet Take 1 tablet (10 mg total) by mouth 3 (three) times daily. 09/15/22  Yes Margarette Canada, NP  ammonium lactate (AMLACTIN) 12 % cream Apply topically. 02/24/22   [provider]  Ascorbic Acid (VITAMIN C) 100 MG tablet  12/21/21   [provider]  ELIQUIS 5 MG TABS tablet  08/27/18   [provider]  lisinopril (ZESTRIL) 2.5 MG tablet Take by mouth. 10/08/19   [provider]  metoprolol  succinate (TOPROL-XL) 25 MG 24 hr tablet Take 25 mg by mouth daily. 03/28/18   [provider]  montelukast (SINGULAIR) 10 MG tablet  08/10/18   [provider]  tamsulosin (FLOMAX) 0.4 MG CAPS capsule Take 1 capsule (0.4 mg total) by mouth daily. 01/25/22   Billey Co, MD  terbinafine (LAMISIL) 250 MG tablet Take 250 mg by mouth daily. 07/03/21   [provider]  triamcinolone ointment (KENALOG) 0.1 % Apply 1 application. topically 2 (two) times daily. 04/12/22   Danton Clap, PA-C  Vitamin D, Ergocalciferol, (DRISDOL) 1.25 MG (50000 UNIT) CAPS capsule  12/21/21   [provider]    Family History Family History  Problem Relation Age of Onset   Heart attack Mother    Uterine cancer Mother    Kidney failure Father     Social History Social History   Tobacco Use   Smoking status: Never    Passive exposure: Never   Smokeless tobacco: Never  Vaping Use   Vaping Use: Never used  Substance Use Topics   Alcohol use: No   Drug use: No     Allergies   Elemental sulfur, Septra [sulfamethoxazole-trimethoprim], and Statins   Review of Systems Review of Systems  Constitutional:  Negative for fever.  Respiratory:  Negative for cough, shortness of breath and wheezing.   Cardiovascular:  Positive for chest pain.       Pain under right ribs with certain movements and deep respiration  Gastrointestinal:  Negative for nausea and vomiting.  Neurological:  Negative for dizziness.     Physical Exam Triage Vital Signs ED Triage Vitals  Enc Vitals Group     BP 09/15/22 1402 107/69     Pulse Rate 09/15/22 1402 82     Resp 09/15/22 1402 16     Temp 09/15/22 1402 98.2 F (36.8 C)     Temp Source 09/15/22 1402 Oral     SpO2 09/15/22 1402 100 %     Weight --      Height --      Head Circumference --      Peak Flow --      Pain Score 09/15/22 1400 3     Pain Loc --      Pain Edu? --      Excl. in [place]? --    No data found.  Updated Vital Signs BP 107/69 (BP Location: Left Arm)   Pulse 82   Temp 98.2 F (36.8 C) (Oral)   Resp 16   SpO2 100%   Visual Acuity Right Eye Distance:   Left Eye Distance:   Bilateral Distance:    Right Eye Near:   Left Eye Near:    Bilateral Near:     Physical Exam Vitals and nursing note reviewed.  Constitutional:      General: He is not in acute distress.    Appearance: Normal appearance. He is not ill-appearing.  HENT:     Head: Normocephalic and atraumatic.  Cardiovascular:     Rate and Rhythm: Normal rate. Rhythm irregular.     Pulses: Normal pulses.     Heart sounds: Normal heart sounds. No  murmur heard.    No friction rub. No gallop.  Pulmonary:     Effort: Pulmonary effort is normal.     Breath sounds: Normal breath sounds. No wheezing, rhonchi or rales.  Chest:     Chest wall: Tenderness present.  Skin:    General: Skin is  warm and dry.     Capillary Refill: Capillary refill takes less than 2 seconds.     Findings: No erythema or rash.  Neurological:     General: No focal deficit present.     Mental Status: He is alert and oriented to person, place, and time.  Psychiatric:        Mood and Affect: Mood normal.        Behavior: Behavior normal.        Thought Content: Thought content normal.        Judgment: Judgment normal.      UC Treatments / Results  Labs (all labs ordered are listed, but only abnormal results are displayed) Labs Reviewed - No data to display  EKG   Radiology DG Chest 2 View  Result Date: 09/15/2022 CLINICAL DATA:  Right chest pain EXAM: CHEST - 2 VIEW COMPARISON:  Chest two-view 03/26/2022 FINDINGS: The heart size and mediastinal contours are within normal limits. Both lungs are clear. The visualized skeletal structures are unremarkable. Mild apical pleural scarring bilaterally. Calcified granuloma left lower lobe unchanged. IMPRESSION: No active cardiopulmonary disease. Electronically Signed   By: Franchot Gallo M.D.   On: 09/15/2022 14:49    Procedures Procedures (including critical care time)  Medications Ordered in UC Medications - No data to display  Initial Impression / Assessment and Plan / UC Course  I have reviewed the triage vital signs and the nursing notes.  Pertinent labs & imaging results that were available during my care of the patient were reviewed by me and considered in my medical decision making (see chart for details).   Patient is a very pleasant, nontoxic-appearing 88 year old male here for evaluation of pain underneath his right ribs that started 2 days ago, resolved, and then returned this morning.  It is  currently there and he describes it as a dull ache that increases when he takes a deep breath or with certain movements.  It is not associate with any cough, fever, shortness of breath above his baseline, or chest pain.  Also no dizziness, nausea, or vomiting.  He does have a history of a rib removal on the right side when he was younger for an empyema.  He has not had any falls and he denies any injuries.  Cardiopulmonary exam reveals S1-S2 heart sounds with regular rhythm.  Lung sounds are clear to auscultation all fields.  Patient does have some tenderness of his inferior and lateral right chest wall but there is no overlying erythema or ecchymosis noted.  No crepitus.  EKG was obtained at triage which shows accelerated junctional rhythm with frequent PVCs and a ventricular rate of 78 bpm.  Patient does have significant cardiac history to include A-fib.  Most recent EKG was from 11/12/2019 which showed atrial fibrillation at a controlled rate of 76 bpm.  Patient does have a cardiologist, Dr. Nehemiah Massed at [facility].  I will obtain chest x-ray to look for any acute cardiopulmonary process.  Radiology impression of chest x-ray states heart size and mediastinal contours are within normal limits in both lungs are clear.  There is mild apical pleural scarring bilaterally.  Calcified granuloma left lower lobe unchanged.  No active cardiopulmonary disease.  I will discharge patient home with a diagnosis of chest wall pain.  I will prescribe him 10 mg of baclofen that he can have every 8 hours as needed to help with the chest wall pain.  BMP from 07/21/2022 shows a BUN of 10 and a  creatinine of 0.9 so there is no need for dosage modification.  He can also use over-the-counter Tylenol help with pain as well as topical lidocaine patches.  I will have him follow-up with his cardiologist regarding his junctional rhythm on EKG.  Have messaged Dr. Nehemiah Massed, the patient's cardiologist, through secure epic chat to advise him of the  EKG findings.   Final Clinical Impressions(s) / UC Diagnoses   Final diagnoses:  Right-sided chest wall pain     Discharge Instructions      Your chest x-ray did not show the presence of any underlying infection or broken ribs to explain the pain you are experiencing under your ribs on the right.  I suspect that you have strained the tissues in between or pulled on some of the scar tissue from your previous surgery and that is what is causing the discomfort with movement and deep breathing.  I want you to take over-the-counter Tylenol according to the package instructions as needed for pain.  You can take the baclofen, 10 mg every 8 hours, to help with muscle pain in your chest wall.  You can also apply topical, over-the-counter, lidocaine patches to your chest wall to help with the discomfort.  Each patch can be in place for 8 hours.  Your EKG also showed a change compared to your most recent EKG in epic from 11/12/2019.  I recommend that you follow-up with your cardiologist, Dr. Nehemiah Massed.  I have messaged him through secure epic chat to advise him of the changes.  If you develop any chest pain, dizziness, increasing shortness of breath, increasing leg swelling, or fainting I recommend you go to the ER for evaluation.     ED Prescriptions     Medication Sig Dispense Auth. Provider   baclofen (LIORESAL) 10 MG tablet Take 1 tablet (10 mg total) by mouth 3 (three) times daily. 19 each Margarette Canada, NP      PDMP not reviewed this encounter.   Margarette Canada, NP 09/15/22 1531

## 2022-09-15 NOTE — ED Triage Notes (Signed)
Pt states 2 days ago he had a sharp pain underneath his right breast near his rib cage. It was for a short period of time. This morning the pain came back. The pain is only sharp when he takes a breath in. At this moment the pain is a dull ache.

## 2022-12-26 ENCOUNTER — Encounter: Payer: Self-pay | Admitting: Emergency Medicine

## 2022-12-26 ENCOUNTER — Ambulatory Visit
Admission: EM | Admit: 2022-12-26 | Discharge: 2022-12-26 | Payer: Medicare Other | Attending: Family Medicine | Admitting: Family Medicine

## 2022-12-26 DIAGNOSIS — Z7901 Long term (current) use of anticoagulants: Secondary | ICD-10-CM | POA: Diagnosis not present

## 2022-12-26 DIAGNOSIS — S01112A Laceration without foreign body of left eyelid and periocular area, initial encounter: Secondary | ICD-10-CM | POA: Diagnosis not present

## 2022-12-26 NOTE — ED Triage Notes (Signed)
Pt missed a step while walking up to his home and fell. He has a cut above his left eye.

## 2022-12-26 NOTE — ED Provider Notes (Signed)
[facility]    CSN: [id number] Arrival date & time: 12/26/22  1633      History   Chief Complaint Chief Complaint  Patient presents with   Fall    HPI Kenneth Watson is a 89 y.o. male.   HPI   Kenneth Watson presents for after a fall up the steps today.  States that he missed a step and then fell hitting his left eye and there is a cut there.  He has been taking Eliquis for A-fib but has not taken in the last 2 days because of an upcoming spinal procedure.       Past Medical History:  Diagnosis Date   Atrial fibrillation ([place])    Cancer ([place]) 2006   Squamous cell carcicoma   Cardiomyopathy ([place])    Congestive heart failure (CHF) (HCC)    GERD (gastroesophageal reflux disease)    Hyperlipidemia    Hypertension    Neuropathy     Patient Active Problem List   Diagnosis Date Noted   Squamous cell carcinoma 03/26/2022   V-tach ([place]) 03/09/2022   Cervical facet syndrome 10/25/2021   Personal history of other malignant neoplasm of skin 08/18/2021   Bilateral carotid artery disease ([place]) 12/16/2020   Moderate mitral regurgitation 25/63/8937   Chronic systolic CHF (congestive heart failure), NYHA class 2 ([place]) 11/27/2017   Bilateral hearing loss 10/25/2017   Bilateral impacted cerumen 10/25/2017   Chronic hand pain, right 04/04/2017   Primary osteoarthritis of right hand 04/04/2017   Essential hypertension 03/23/2017   Gastroesophageal reflux disease 03/23/2017   Nasal congestion with rhinorrhea 03/23/2017   ED (erectile dysfunction) of organic origin 06/28/2016   Frequent PVCs 05/19/2015   Moderate aortic valve insufficiency 05/12/2015   Moderate tricuspid insufficiency 01/07/2015   Chronic a-fib ([place]) 09/03/2014   Dermatochalasis 02/25/2014   Mechanical ptosis 01/21/2014   Pseudophakia of both eyes 04/12/2013   Bicipital tenosynovitis 12/28/2012   Rotator cuff tear 11/02/2012   Hearing loss, sensorineural, asymmetrical 10/24/2012   BPH (benign  prostatic hyperplasia) 09/25/2012   Shoulder sprain 09/17/2012   Hyperlipidemia, mixed 06/03/2011   Low back pain 06/03/2011   Nocturia 06/03/2011    Past Surgical History:  Procedure Laterality Date   FRACTURE SURGERY  89 yrs old   elbow   [place] Medications    Prior to Admission medications   Medication Sig Start Date End Date Taking? Authorizing Provider  ammonium lactate (AMLACTIN) 12 % cream Apply topically. 02/24/22   [provider]  Ascorbic Acid (VITAMIN C) 100 MG tablet  12/21/21   [provider]  baclofen (LIORESAL) 10 MG tablet Take 1 tablet (10 mg total) by mouth 3 (three) times daily. 09/15/22   Margarette Canada, NP  ELIQUIS 5 MG TABS tablet  08/27/18   [provider]  lisinopril (ZESTRIL) 2.5 MG tablet Take by mouth. 10/08/19   [provider]  metoprolol succinate (TOPROL-XL) 25 MG 24 hr tablet Take 25 mg by mouth daily. 03/28/18   [provider]  montelukast (SINGULAIR) 10 MG tablet  08/10/18   [provider]  tamsulosin (FLOMAX) 0.4 MG CAPS capsule Take 1 capsule (0.4 mg total) by mouth daily. 01/25/22   Billey Co, MD  terbinafine (LAMISIL) 250 MG tablet Take 250 mg by mouth daily. 07/03/21   [provider]  triamcinolone ointment (KENALOG) 0.1 % Apply 1 application. topically 2 (two) times daily. 04/12/22  Danton Clap, PA-C  Vitamin D, Ergocalciferol, (DRISDOL) 1.25 MG (50000 UNIT) CAPS capsule  12/21/21   [provider]    Family History Family History  Problem Relation Age of Onset   Heart attack Mother    Uterine cancer Mother    Kidney failure Father     Social History Social History   Tobacco Use   Smoking status: Never    Passive exposure: Never   Smokeless tobacco: Never  Vaping Use   Vaping Use: Never used  Substance Use Topics   Alcohol use: No   Drug use: No     Allergies   Elemental sulfur, Septra  [sulfamethoxazole-trimethoprim], and Statins   Review of Systems Review of Systems: negative unless otherwise stated in HPI.      Physical Exam Triage Vital Signs ED Triage Vitals  Enc Vitals Group     BP 12/26/22 1706 138/69     Pulse Rate 12/26/22 1704 (!) 51     Resp 12/26/22 1704 16     Temp 12/26/22 1704 98.1 F (36.7 C)     Temp Source 12/26/22 1704 Oral     SpO2 12/26/22 1704 97 %     Weight --      Height --      Head Circumference --      Peak Flow --      Pain Score 12/26/22 1704 0     Pain Loc --      Pain Edu? --      Excl. in [place]? --    No data found.  Updated Vital Signs BP 138/69   Pulse (!) 51   Temp 98.1 F (36.7 C) (Oral)   Resp 16   SpO2 97%   Visual Acuity Right Eye Distance:   Left Eye Distance:   Bilateral Distance:    Right Eye Near:   Left Eye Near:    Bilateral Near:     Physical Exam GEN:     alert, pleasant elderly male and no distress    HENT:  mucus membranes moist, 1.5-2 cm laceration in the left eyebrow, nares patent, no nasal discharge  EYES:   pupils equal and reactive, EOM intact NECK:  supple, normal ROM, no lymphadenopathy  RESP:  clear to auscultation bilaterally EXT:   Baseline ROM NEURO:  speech normal, alert and oriented   Skin:   warm and dry, no rash, normal skin turgor Psych: Normal affect, appropriate speech and behavior      UC Treatments / Results  Labs (all labs ordered are listed, but only abnormal results are displayed) Labs Reviewed - No data to display  EKG   Radiology No results found.  Procedures Procedures (including critical care time)  Medications Ordered in UC Medications - No data to display  Initial Impression / Assessment and Plan / UC Course  I have reviewed the triage vital signs and the nursing notes.  Pertinent labs & imaging results that were available during my care of the patient were reviewed by me and considered in my medical decision making (see chart for details).        Patient is a 89 y.o. male  who presents for eyebrow laceration after a fall at home today. He takes Eliquis for AFIB.  Overall patient is nontoxic-appearing and afebrile. Advised ED evaluation for CT Head to rule out intracranial hemorrhage given his head trauma. He and his wife voiced understanding. Wound dressed with sterile dressing. Pt to travel via private  vehicle to [facility].    ED and return precautions given and patient/guardian voiced understanding. Discussed MDM, treatment plan and plan for follow-up with patient and his wife who agree with plan.     Final Clinical Impressions(s) / UC Diagnoses   Final diagnoses:  Long term current use of anticoagulant therapy  Laceration of left eyebrow, initial encounter     Discharge Instructions      You have been advised to follow up immediately in the emergency department for concerning signs or symptoms as discussed during your visit. If you declined EMS transport, please have a family member take you directly to the ED at this time. Do not delay.   Based on concerns about condition, if you do not follow up in the ED, you may risk poor outcomes including worsening of condition, delayed treatment and potentially life threatening issues. If you have declined to go to the ED at this time, you should call your PCP immediately to set up a follow up appointment.   Go to ED for red flag symptoms, including; fevers you cannot reduce with Tylenol/Motrin, severe headaches, vision changes, numbness/weakness in part of the body, lethargy, confusion, intractable vomiting, severe dehydration, chest pain, breathing difficulty, severe persistent abdominal or pelvic pain, signs of severe infection (increased redness, swelling of an area), feeling faint or passing out, dizziness, etc. You should especially go to the ED for sudden acute worsening of condition if you do not elect to go at this time.      ED Prescriptions   None    PDMP not  reviewed this encounter.   Lyndee Hensen, DO 12/26/22 1721

## 2022-12-26 NOTE — Discharge Instructions (Signed)

## 2022-12-26 NOTE — ED Notes (Signed)
Patient is being discharged from the Urgent Care and sent to the Emergency Department via personal vehicle . Per Dr. Susa Simmonds, patient is in need of higher level of care due to Fall. Patient is aware and verbalizes understanding of plan of care.  Vitals:   12/26/22 1704 12/26/22 1706  BP:  138/69  Pulse: (!) 51   Resp: 16   Temp: 98.1 F (36.7 C)   SpO2: 97%

## 2023-01-06 ENCOUNTER — Other Ambulatory Visit: Payer: Self-pay

## 2023-01-06 MED ORDER — TAMSULOSIN HCL 0.4 MG PO CAPS: 0.4000 mg | ORAL_CAPSULE | Freq: Every day | ORAL | 11 refills | Status: DC

## 2023-01-06 NOTE — Addendum Note (Signed)
Addended by: Kris Mouton on: 01/06/2023 01:02 PM   Modules accepted: Orders

## 2023-01-06 NOTE — Telephone Encounter (Signed)
Patient asked to have medication re sent to [facility] pharmacy, I updated pharmacies and refill re sent

## 2023-01-06 NOTE — Telephone Encounter (Signed)
Patient and his wife advised.

## 2023-01-06 NOTE — Telephone Encounter (Signed)
Patient left message on triage line asking for refill of Tamsulosin, states he has about 10 days left and wanted to make sure he gets more refills on file. CB [phone]

## 2023-02-19 ENCOUNTER — Ambulatory Visit
Admission: EM | Admit: 2023-02-19 | Discharge: 2023-02-19 | Disposition: A | Discharge status: Home / Self Care | Payer: Medicare Other | Attending: Physician Assistant | Admitting: Physician Assistant

## 2023-02-19 ENCOUNTER — Encounter: Payer: Self-pay | Admitting: Emergency Medicine

## 2023-02-19 DIAGNOSIS — J069 Acute upper respiratory infection, unspecified: Secondary | ICD-10-CM | POA: Diagnosis present

## 2023-02-19 DIAGNOSIS — Z1152 Encounter for screening for COVID-19: Secondary | ICD-10-CM | POA: Diagnosis not present

## 2023-02-19 LAB — RESP PANEL BY RT-PCR (RSV, FLU A&B, COVID)  RVPGX2
Influenza A by PCR: NEGATIVE
Influenza B by PCR: NEGATIVE
Resp Syncytial Virus by PCR: NEGATIVE
SARS Coronavirus 2 by RT PCR: NEGATIVE

## 2023-02-19 MED ORDER — BENZONATATE 100 MG PO CAPS: 100.0000 mg | ORAL_CAPSULE | Freq: Three times a day (TID) | ORAL | 0 refills | Status: DC

## 2023-02-19 MED ORDER — GUAIFENESIN-CODEINE 100-10 MG/5ML PO SOLN: 5.0000 mL | Freq: Every evening | ORAL | 0 refills | Status: DC | PRN

## 2023-02-19 NOTE — ED Provider Notes (Signed)
[facility]    CSN: [id number] Arrival date & time: 02/19/23  [id number]      History   Chief Complaint Chief Complaint  Patient presents with   Cough   Headache    HPI Kenneth Watson is a 89 y.o. male.   Patient presents for evaluation of nasal congestion, rhinorrhea, body aches, increased fatigue and a productive cough beginning last night.  Known sick contact within household.  Has attempted use of 1 dose of Zyrtec which has been minimally effective.  Tolerating food and liquids.  Denies shortness of breath, wheezing or fever.   Past Medical History:  Diagnosis Date   Atrial fibrillation ([place])    Cancer ([place]) 2006   Squamous cell carcicoma   Cardiomyopathy ([place])    Congestive heart failure (CHF) (HCC)    GERD (gastroesophageal reflux disease)    Hyperlipidemia    Hypertension    Neuropathy     Patient Active Problem List   Diagnosis Date Noted   Squamous cell carcinoma 03/26/2022   V-tach ([place]) 03/09/2022   Cervical facet syndrome 10/25/2021   Personal history of other malignant neoplasm of skin 08/18/2021   Bilateral carotid artery disease ([place]) 12/16/2020   Moderate mitral regurgitation [id number]   Chronic systolic CHF (congestive heart failure), NYHA class 2 ([place]) 11/27/2017   Bilateral hearing loss 10/25/2017   Bilateral impacted cerumen 10/25/2017   Chronic hand pain, right 04/04/2017   Primary osteoarthritis of right hand 04/04/2017   Essential hypertension 03/23/2017   Gastroesophageal reflux disease 03/23/2017   Nasal congestion with rhinorrhea 03/23/2017   ED (erectile dysfunction) of organic origin 06/28/2016   Frequent PVCs 05/19/2015   Moderate aortic valve insufficiency 05/12/2015   Moderate tricuspid insufficiency 01/07/2015   Chronic a-fib ([place]) 09/03/2014   Dermatochalasis 02/25/2014   Mechanical ptosis 01/21/2014   Pseudophakia of both eyes 04/12/2013   Bicipital tenosynovitis 12/28/2012   Rotator cuff tear 11/02/2012   Hearing  loss, sensorineural, asymmetrical 10/24/2012   BPH (benign prostatic hyperplasia) 09/25/2012   Shoulder sprain 09/17/2012   Hyperlipidemia, mixed 06/03/2011   Low back pain 06/03/2011   Nocturia 06/03/2011    Past Surgical History:  Procedure Laterality Date   FRACTURE SURGERY  89 yrs old   elbow   [place] Medications    Prior to Admission medications   Medication Sig Start Date End Date Taking? Authorizing Provider  ammonium lactate (AMLACTIN) 12 % cream Apply topically. 02/24/22   [provider]  Ascorbic Acid (VITAMIN C) 100 MG tablet  12/21/21   [provider]  baclofen (LIORESAL) 10 MG tablet Take 1 tablet (10 mg total) by mouth 3 (three) times daily. 09/15/22   Margarette Canada, NP  ELIQUIS 5 MG TABS tablet  08/27/18   [provider]  lisinopril (ZESTRIL) 2.5 MG tablet Take by mouth. 10/08/19   [provider]  metoprolol succinate (TOPROL-XL) 25 MG 24 hr tablet Take 25 mg by mouth daily. 03/28/18   [provider]  montelukast (SINGULAIR) 10 MG tablet  08/10/18   [provider]  tamsulosin (FLOMAX) 0.4 MG CAPS capsule Take 1 capsule (0.4 mg total) by mouth daily. 01/06/23   Billey Co, MD  terbinafine (LAMISIL) 250 MG tablet Take 250 mg by mouth daily. 07/03/21   [provider]  triamcinolone ointment (KENALOG) 0.1 % Apply 1 application. topically 2 (two) times daily. 04/12/22   Danton Clap,  PA-C  Vitamin D, Ergocalciferol, (DRISDOL) 1.25 MG (50000 UNIT) CAPS capsule  12/21/21   [provider]    Family History Family History  Problem Relation Age of Onset   Heart attack Mother    Uterine cancer Mother    Kidney failure Father     Social History Social History   Tobacco Use   Smoking status: Never    Passive exposure: Never   Smokeless tobacco: Never  Vaping Use   Vaping Use: Never used  Substance Use Topics   Alcohol use: No   Drug use: No      Allergies   Elemental sulfur, Septra [sulfamethoxazole-trimethoprim], and Statins   Review of Systems Review of Systems  Constitutional: Negative.   HENT:  Positive for congestion and rhinorrhea. Negative for dental problem, drooling, ear discharge, ear pain, facial swelling, hearing loss, mouth sores, nosebleeds, postnasal drip, sinus pressure, sinus pain, sneezing, sore throat, tinnitus, trouble swallowing and voice change.   Respiratory:  Positive for cough. Negative for apnea, choking, chest tightness, shortness of breath, wheezing and stridor.   Cardiovascular: Negative.   Gastrointestinal: Negative.   Skin: Negative.   Neurological:  Positive for headaches. Negative for dizziness, tremors, seizures, syncope, facial asymmetry, speech difficulty, weakness, light-headedness and numbness.     Physical Exam Triage Vital Signs ED Triage Vitals  Enc Vitals Group     BP 02/19/23 0932 94/66     Pulse Rate 02/19/23 0932 (!) 56     Resp 02/19/23 0932 16     Temp 02/19/23 0932 98.7 F (37.1 C)     Temp Source 02/19/23 0932 Oral     SpO2 02/19/23 0932 95 %     Weight 02/19/23 0930 165 lb (74.8 kg)     Height 02/19/23 0930 5\' 9"  (1.753 m)     Head Circumference --      Peak Flow --      Pain Score 02/19/23 0930 5     Pain Loc --      Pain Edu? --      Excl. in [place]? --    No data found.  Updated Vital Signs BP 94/66 (BP Location: Left Arm)   Pulse (!) 56   Temp 98.7 F (37.1 C) (Oral)   Resp 16   Ht 5\' 9"  (1.753 m)   Wt 165 lb (74.8 kg)   SpO2 95%   BMI 24.37 kg/m   Visual Acuity Right Eye Distance:   Left Eye Distance:   Bilateral Distance:    Right Eye Near:   Left Eye Near:    Bilateral Near:     Physical Exam Constitutional:      Appearance: Normal appearance.  HENT:     Head: Normocephalic.     Right Ear: Tympanic membrane, ear canal and external ear normal.     Left Ear: Tympanic membrane, ear canal and external ear normal.     Nose: Congestion  and rhinorrhea present.     Mouth/Throat:     Mouth: Mucous membranes are moist.     Pharynx: No posterior oropharyngeal erythema.  Eyes:     Extraocular Movements: Extraocular movements intact.  Cardiovascular:     Rate and Rhythm: Normal rate and regular rhythm.     Pulses: Normal pulses.     Heart sounds: Normal heart sounds.  Pulmonary:     Effort: Pulmonary effort is normal.     Breath sounds: Normal breath sounds.  Skin:    General: Skin is  warm and dry.  Neurological:     Mental Status: He is alert and oriented to person, place, and time. Mental status is at baseline.  Psychiatric:        Mood and Affect: Mood normal.        Behavior: Behavior normal.      UC Treatments / Results  Labs (all labs ordered are listed, but only abnormal results are displayed) Labs Reviewed  RESP PANEL BY RT-PCR (RSV, FLU A&B, COVID)  [id number]    EKG   Radiology No results found.  Procedures Procedures (including critical care time)  Medications Ordered in UC Medications - No data to display  Initial Impression / Assessment and Plan / UC Course  I have reviewed the triage vital signs and the nursing notes.  Pertinent labs & imaging results that were available during my care of the patient were reviewed by me and considered in my medical decision making (see chart for details).  Viral URI with cough  Patient is in no signs of distress nor toxic appearing.  Vital signs are stable.  Low suspicion for pneumonia, pneumothorax or bronchitis and therefore will defer imaging.  COVID, flu and RSV testing negative.  Prescribed guaifenesin codeine and Tessalon.  PDMP reviewed, low risk, has tolerated medication in the past.May use additional over-the-counter medications as needed for supportive care.  May follow-up with urgent care as needed if symptoms persist or worsen.   Final Clinical Impressions(s) / UC Diagnoses   Final diagnoses:  Viral URI with cough     Discharge Instructions       Your symptoms today are most likely being caused by a virus and should steadily improve in time it can take up to 7 to 10 days before you truly start to see a turnaround however things will get better  COVID, flu and RSV testing is negative  Begin use of azithromycin as directed, this antibiotic is taken symptoms from progressing to more serious infection such as pneumonia    You can take Tylenol and/or Ibuprofen as needed for fever reduction and pain relief.   For cough: honey 1/2 to 1 teaspoon (you can dilute the honey in water or another fluid).  You can also use guaifenesin and dextromethorphan for cough. You can use a humidifier for chest congestion and cough.  If you don't have a humidifier, you can sit in the bathroom with the hot shower running.      For sore throat: try warm salt water gargles, cepacol lozenges, throat spray, warm tea or water with lemon/honey, popsicles or ice, or OTC cold relief medicine for throat discomfort.   For congestion: take a daily anti-histamine like Zyrtec, Claritin, and a oral decongestant, such as pseudoephedrine.  You can also use Flonase 1-2 sprays in each nostril daily.   It is important to stay hydrated: drink plenty of fluids (water, gatorade/powerade/pedialyte, juices, or teas) to keep your throat moisturized and help further relieve irritation/discomfort.    ED Prescriptions   None    PDMP not reviewed this encounter.   Hans Eden, NP 02/19/23 1049

## 2023-02-19 NOTE — Discharge Instructions (Addendum)
Your symptoms today are most likely being caused by a virus and should steadily improve in time it can take up to 7 to 10 days before you truly start to see a turnaround however things will get better  COVID, flu and RSV testing is negative  May use Tessalon Perles every 8 hours as needed to help calm your coughing  You may use cough syrup at bedtime to allow you to rest  You may purchase over-the-counter Coricidin to help with your congestion as this medicine will not affect your blood pressure  Begin use of azithromycin as directed, this antibiotic is taken symptoms from progressing to more serious infection such as pneumonia    You can take Tylenol and/or Ibuprofen as needed for fever reduction and pain relief.   For cough: honey 1/2 to 1 teaspoon (you can dilute the honey in water or another fluid).  You can also use guaifenesin and dextromethorphan for cough. You can use a humidifier for chest congestion and cough.  If you don't have a humidifier, you can sit in the bathroom with the hot shower running.      For sore throat: try warm salt water gargles, cepacol lozenges, throat spray, warm tea or water with lemon/honey, popsicles or ice, or OTC cold relief medicine for throat discomfort.   For congestion: take a daily anti-histamine like Zyrtec, Claritin, and a oral decongestant, such as pseudoephedrine.  You can also use Flonase 1-2 sprays in each nostril daily.   It is important to stay hydrated: drink plenty of fluids (water, gatorade/powerade/pedialyte, juices, or teas) to keep your throat moisturized and help further relieve irritation/discomfort.

## 2023-02-19 NOTE — ED Triage Notes (Signed)
Patient c/o cough, headache, nasal congestion and runny nose that started last night.  Patient unsure of fevers.

## 2023-05-08 ENCOUNTER — Other Ambulatory Visit: Payer: Self-pay

## 2023-05-08 DIAGNOSIS — N138 Other obstructive and reflux uropathy: Secondary | ICD-10-CM

## 2023-05-09 ENCOUNTER — Encounter: Payer: Self-pay | Admitting: Urology

## 2023-05-09 ENCOUNTER — Ambulatory Visit (INDEPENDENT_AMBULATORY_CARE_PROVIDER_SITE_OTHER): Payer: Medicare Other | Admitting: Urology

## 2023-05-09 VITALS — BP 113/67 | HR 65 | Ht 69.0 in | Wt 161.0 lb

## 2023-05-09 DIAGNOSIS — R351 Nocturia: Secondary | ICD-10-CM

## 2023-05-09 DIAGNOSIS — N401 Enlarged prostate with lower urinary tract symptoms: Secondary | ICD-10-CM | POA: Diagnosis not present

## 2023-05-09 LAB — BLADDER SCAN AMB NON-IMAGING

## 2023-05-09 NOTE — Progress Notes (Signed)
   05/09/2023 9:27 AM   [place] 06-22-1931 [id number]  Reason for visit: Follow up BPH, nocturia  HPI: 89 year old male with history of BPH and TURP about 20 years ago that resolved most of his urinary symptoms.  He has a history of incontinence overnight that was associated with melatonin, and resolved after discontinuing that medication.  Primary issue is nocturia 2-3 times per night, and we have previously discussed behavioral strategies including compression stockings in the evening, low-salt diet, and minimizing fluids before bedtime.  At our visit in March 2023 we also started Flomax to see if we could improve some of his urinary symptoms and nocturia, as well as for his mildly elevated PVR of .  I had previously offered cystoscopy for further evaluation of regrowth of adenoma or bladder neck contracture, but he deferred.  He noticed improvement in his urinary stream during the day on the Flomax, and PVR is stable today at 0mL.  He did not notice any improvement in the overnight urination, and continues to have nocturia 2-4 times overnight.  We again reviewed normal expectations regarding nocturia, and behavioral strategies.    He really denies urinary symptoms during the day.  He does have lower extremity edema and CHF.  Nocturia most likely related to these issues as opposed to BPH.  Behavioral strategies discussed including avoiding bladder irritants, minimizing fluids prior to bed, lower extremity compression socks, elevating the legs in the afternoon.  Continue Flomax(PVRs improved from 160 to 0ml on Flomax) Return precautions discussed RTC 1 year PVR Re-discuss cystoscopy if worsening symptoms   Sondra Come, MD  [facility] [address], [address] [place], [place] [address] [phone]

## 2023-05-09 NOTE — Patient Instructions (Signed)
Recommend minimizing fluids 3 to 4 hours prior to bedtime.  Avoid caffeine, carbonated drinks, or diet drinks in the evenings.  Wear compression socks during the day to help prevent lower leg swelling, you can also elevate your legs in the afternoon which will help prevent overnight urination

## 2023-05-16 ENCOUNTER — Ambulatory Visit: Payer: Medicare Other | Admitting: Urology

## 2023-09-05 NOTE — Progress Notes (Signed)
 Established Patient Visit   Chief Complaint: FU HF, Afib  Date of Service: 09/05/2023 Date of Birth: 1931-05-11 PCP: Eliverto Bette Hover, MD  History of Present Illness: Mr. Scoggin is a 89 y.o.male patient who presented for follow-up of heart failure, atrial fibrillation.  He used to follow-up with Dr. Hester and Alan Mam, PA, this is first visit with me.  Past medical history significant for heart failure with reduced EF thought to be related to A-fib with improved LVEF on last echo, chronic atrial fibrillation since 2011, moderate MR/TR, hypertension, frequent PVCs/short NSVT.  Last echo 02/2022 reported normal biventricular systolic function with LVEF greater than 55%, moderate MR/TR, moderate to severe biatrial enlargement.  Stress test 02/2022 without any ischemia.  Today patient is accompanied by his wife to visit.  Details that he is doing well from cardiac standpoint.  Exercises 3-5 times a week, walks for 45 minutes at a time on plain ground without any complaints of chest pain/pressure or increased dyspnea.  If he tries to walk uphill, has exertional dyspnea which is stable.  No pedal edema.  No orthopnea.  Mentions of dizziness while bending.  No syncope.  EKG today showed atrial fibrillation with PVCs.  Past Medical and Surgical History  Past Medical History Past Medical History:  Diagnosis Date  . Actinic keratosis   . Allergic state    sulfur  . Arrhythmia 1999  . Arthritis 2004  . Atrial fibrillation (CMS/HHS-HCC)   . Bilateral carotid artery disease (CMS-HCC) 12/16/2020  . Bilateral hearing loss 10/25/2017  . BPH (benign prostatic hyperplasia)   . Cardiomyopathy (CMS/HHS-HCC)   . CHF (congestive heart failure) (CMS/HHS-HCC) 2002  . Constipation 07/13/2022  . COVID-19 08/2019  . COVID-19 11/2021  . GERD (gastroesophageal reflux disease) 2008   medication  . Heart disease 2012  . Hyperlipidemia   . Hypertension   . Microscopic hematuria   . Neuropathy   .  Squamous cell carcinoma 2006   skin on back   . V-tach (CMS/HHS-HCC) 03/09/2022   7beat run on holter which was asymtomatic  . VHD (valvular heart disease)     Past Surgical History He has a past surgical history that includes multiple prostate biopsies; S/P DIRECT INGUINAL HERNIA REPAIR LEFT; Inguinal hernia repair (Bilateral, [id number]) ~1998(L)); Fracture surgery (89 years old); Knee arthroscopy; Prostate surgery; Tonsillectomy; Vasectomy; blepharoplasty upper eyelid (Bilateral, 02/25/14); extraction cataract extracapsular w/insertion intraocular prosthesis (Left, 01/14/08); extraction cataract extracapsular w/insertion intraocular prosthesis (Right, 03/03/08); and Sinus surgery.   Medications and Allergies  Current Medications  Current Outpatient Medications  Medication Sig Dispense Refill  . ELIQUIS  5 mg tablet TAKE 1 TABLET TWICE A DAY 180 tablet 3  . flaxseed oiL Oil Use 1 capsule once daily    . linaCLOtide  (LINZESS ) 72 mcg capsule Take 1 capsule (72 mcg total) by mouth once daily 30 capsule 5  . montelukast  (SINGULAIR ) 10 mg tablet TAKE 1 TABLET AT BEDTIME 90 tablet 3  . polyethylene glycol (MIRALAX) packet Take 17 g by mouth once daily as needed for Constipation Mix in 4-8ounces of fluid prior to taking.    . propylene glycol/peg 400/PF (SYSTANE, PF, OPHTH) Apply 1 drop to eye 3 (three) times daily as needed (dry eyes)    . sennosides-docusate (SENOKOT-S) 8.6-50 mg tablet Take 1 tablet by mouth once daily as needed for Constipation    . tamsulosin  (FLOMAX ) 0.4 mg capsule Take 0.4 mg by mouth once daily Take 30 minutes after same meal each day.    SABRA  benzonatate  (TESSALON  ORAL) Take by mouth    . clobetasoL (TEMOVATE) 0.05 % cream Apply topically twice a day to affected areas for no more than two weeks at a time. Do not apply to face or skin folds. 45 g 4  . fluticasone  propion-salmeteroL (ADVAIR DISKUS) 100-50 mcg/dose diskus inhaler Inhale 1 Puff into the lungs every 12 (twelve) hours for  30 days (Patient not taking: Reported on 09/05/2023) 60 each 1  . fluticasone  propionate (FLONASE ) 50 mcg/actuation nasal spray Place 2 sprays into both nostrils once daily 16 g 11  . metoprolol  succinate (TOPROL -XL) 50 MG XL tablet Take 1 tablet (50 mg total) by mouth once daily 30 tablet 11   No current facility-administered medications for this visit.    Allergies: Sulfa (sulfonamide antibiotics), Aspirin, Septra [sulfamethoxazole-trimethoprim], and Statins-hmg-coa reductase inhibitors  Social and Family History  Social History  reports that he has never smoked. He has never used smokeless tobacco. He reports that he does not drink alcohol and does not use drugs.  Family History Family History  Problem Relation Name Age of Onset  . Myocardial Infarction (Heart attack) Mother maudine   . Ovarian cancer Mother maudine   . Cancer Mother maudine   . Myocardial Infarction (Heart attack) Father    . Kidney failure Father    . Alcohol abuse Father    . Heart disease Brother    . Arthritis Other    . Heart disease Brother tom        deceased  . Prostate cancer Neg Hx    . Breast cancer Neg Hx    . Glaucoma Neg Hx    . Macular degeneration Neg Hx    . Diabetes Neg Hx    . High blood pressure (Hypertension) Neg Hx    . Blindness Neg Hx      Review of Systems   Review of Systems: T No chest pain No increased exertional dyspnea  Physical Examination   Vitals:BP 128/80 (BP Location: Left upper arm, Patient Position: Sitting, BP Cuff Size: Adult)   Pulse 66   Resp 16   Ht 177.8 cm (5' 10)   Wt 72.6 kg (160 lb)   SpO2 99%   BMI 22.96 kg/m  Ht:177.8 cm (5' 10) Wt:72.6 kg (160 lb) ADJ:Anib surface area is 1.89 meters squared. Body mass index is 22.96 kg/m.  HEENT: Pupils equally reactive to light and accomodation  Neck: Supple, no significant JVD Lungs: clear to auscultation bilaterally; no wheezes, rales, rhonchi Heart: Irregular rhythm, normal rate.  Grade 2 systolic  murmur left lower sternal border Extremities: His pedal edema  Assessment and Plan   89 y.o. male with Chronic atrial fibrillation since 2011 Chronic systolic dysfunction with improved LVEF on last echo Moderate mitral regurgitation Moderate tricuspid regurgitation Frequent PVCs Hypertension  Patient stable from cardiac standpoint.  Has baseline NYHA class II exertional dyspnea.  Euvolemic on exam.  Blood pressure and heart rate well-controlled.  Will increase Toprol -XL to 50 mg daily to help with PVCs.  Continue Eliquis  for anticoagulation with elevated [place] Vasc score.  Will repeat echocardiogram before next visit to follow-up on LVEF, MR/TR.  Orders Placed This Encounter  Procedures  . ECG 12-lead  . Echo complete    Return in about 6 months (around 03/05/2024).  KRISHNA CHAITANYA ALLURI, MD  This dictation was prepared with dragon dictation. Any transcription errors that result from this process are unintentional.

## 2023-10-08 ENCOUNTER — Ambulatory Visit
Admission: EM | Admit: 2023-10-08 | Discharge: 2023-10-08 | Disposition: A | Discharge status: Home / Self Care | Payer: Medicare Other | Attending: Family Medicine | Admitting: Family Medicine

## 2023-10-08 ENCOUNTER — Encounter: Payer: Self-pay | Admitting: Emergency Medicine

## 2023-10-08 DIAGNOSIS — H00012 Hordeolum externum right lower eyelid: Secondary | ICD-10-CM | POA: Diagnosis not present

## 2023-10-08 MED ORDER — ERYTHROMYCIN 5 MG/GM OP OINT: TOPICAL_OINTMENT | OPHTHALMIC | 0 refills | Status: DC

## 2023-10-08 NOTE — ED Provider Notes (Signed)
[facility]    CSN: [id number] Arrival date & time: 10/08/23  1349      History   Chief Complaint Chief Complaint  Patient presents with   Stye    right    HPI HPI  Kenneth Watson is a 89 y.o. male.    Tj presents for concern for stye on his right eye that been there fore 3 weeks.  At times he can see it in his vision.  The area is slightly painful. Denies blurry vision and drainage.    Past Medical History:  Diagnosis Date   Atrial fibrillation (HCC)    Cancer (HCC) 2006   Squamous cell carcicoma   Cardiomyopathy (HCC)    Congestive heart failure (CHF) (HCC)    GERD (gastroesophageal reflux disease)    Hyperlipidemia    Hypertension    Neuropathy     Patient Active Problem List   Diagnosis Date Noted   Squamous cell carcinoma 03/26/2022   V-tach (HCC) 03/09/2022   Cervical facet syndrome 10/25/2021   Personal history of other malignant neoplasm of skin 08/18/2021   Bilateral carotid artery disease (HCC) 12/16/2020   Moderate mitral regurgitation 11/18/2020   Chronic systolic CHF (congestive heart failure), NYHA class 2 (HCC) 11/27/2017   Bilateral hearing loss 10/25/2017   Bilateral impacted cerumen 10/25/2017   Chronic hand pain, right 04/04/2017   Primary osteoarthritis of right hand 04/04/2017   Essential hypertension 03/23/2017   Gastroesophageal reflux disease 03/23/2017   Nasal congestion with rhinorrhea 03/23/2017   ED (erectile dysfunction) of organic origin 06/28/2016   Frequent PVCs 05/19/2015   Moderate aortic valve insufficiency 05/12/2015   Moderate tricuspid insufficiency 01/07/2015   Chronic a-fib (HCC) 09/03/2014   Dermatochalasis 02/25/2014   Mechanical ptosis 01/21/2014   Pseudophakia of both eyes 04/12/2013   Bicipital tenosynovitis 12/28/2012   Rotator cuff tear 11/02/2012   Hearing loss, sensorineural, asymmetrical 10/24/2012   BPH (benign prostatic hyperplasia) 09/25/2012   Shoulder sprain 09/17/2012    Hyperlipidemia, mixed 06/03/2011   Low back pain 06/03/2011   Nocturia 06/03/2011    Past Surgical History:  Procedure Laterality Date   FRACTURE SURGERY  89 yrs old   elbow   HERNIA REPAIR     VASECTOMY         Home Medications    Prior to Admission medications   Medication Sig Start Date End Date Taking? Authorizing Provider  erythromycin ophthalmic ointment Place a 1/2 inch ribbon of ointment into the lower eyelid 3 times day for 7 days. 10/08/23  Yes Javarius Tsosie, DO  ammonium lactate (AMLACTIN) 12 % cream Apply topically. 02/24/22   [provider]  apixaban (ELIQUIS) 5 MG TABS tablet Take 1 tablet by mouth 2 (two) times daily. 03/22/23   [provider]  Flaxseed Oil (LINSEED OIL) OIL Use 1 capsule once daily    [provider]  fluticasone-salmeterol (ADVAIR) 100-50 MCG/ACT AEPB Inhale into the lungs. 03/02/23   [provider]  lisinopril (ZESTRIL) 2.5 MG tablet Take by mouth. 10/08/19   [provider]  metoprolol succinate (TOPROL-XL) 25 MG 24 hr tablet Take 25 mg by mouth daily. 03/28/18   [provider]  montelukast (SINGULAIR) 10 MG tablet  08/10/18   [provider]  polyethylene glycol (MIRALAX / GLYCOLAX) 17 g packet Take 17 g by mouth daily as needed for mild constipation.    [provider]  senna-docusate (SENOKOT-S) 8.6-50 MG tablet Take 1 tablet by mouth daily as needed for  mild constipation.    [provider]  tamsulosin (FLOMAX) 0.4 MG CAPS capsule Take 1 capsule (0.4 mg total) by mouth daily. 01/06/23   Sondra Come, MD    Family History Family History  Problem Relation Age of Onset   Heart attack Mother    Uterine cancer Mother    Kidney failure Father     Social History Social History   Tobacco Use   Smoking status: Never    Passive exposure: Never   Smokeless tobacco: Never  Vaping Use   Vaping status: Never Used  Substance Use Topics   Alcohol use: No   Drug  use: No     Allergies   Elemental sulfur, Septra [sulfamethoxazole-trimethoprim], and Statins   Review of Systems Review of Systems : negative unless otherwise stated in HPI.      Physical Exam Triage Vital Signs ED Triage Vitals  Encounter Vitals Group     BP 10/08/23 1429 99/62     Systolic BP Percentile --      Diastolic BP Percentile --      Pulse Rate 10/08/23 1429 75     Resp 10/08/23 1429 15     Temp 10/08/23 1429 98.1 F (36.7 C)     Temp Source 10/08/23 1429 Oral     SpO2 10/08/23 1429 99 %     Weight 10/08/23 1427 160 lb 15 oz (73 kg)     Height 10/08/23 1427 5\' 9"  (1.753 m)     Head Circumference --      Peak Flow --      Pain Score 10/08/23 1427 2     Pain Loc --      Pain Education --      Exclude from Growth Chart --    No data found.  Updated Vital Signs BP 99/62 (BP Location: Right Arm)   Pulse 75   Temp 98.1 F (36.7 C) (Oral)   Resp 15   Ht 5\' 9"  (1.753 m)   Wt 73 kg   SpO2 99%   BMI 23.77 kg/m   Visual Acuity Right Eye Distance:   Left Eye Distance:   Bilateral Distance:    Right Eye Near:   Left Eye Near:    Bilateral Near:     Physical Exam  GEN: pleasant well appearing elderly  male, in no acute distress  RESP: no increased work of breathing EYES:    Vision grossly intact. Gaze aligned appropriately.        Right eye: No discharge or foreign body.  Vernona Rieger external hordeolum present that is tender to palpation    Left eye: No foreign body, discharge or hordeolum.     Extraocular Movements: Extraocular movements intact.     Conjunctiva/sclera: not injected. No chemosis or hemorrhage.    SKIN: warm and dry   UC Treatments / Results  Labs (all labs ordered are listed, but only abnormal results are displayed) Labs Reviewed - No data to display  EKG   Radiology No results found.  Procedures Procedures (including critical care time)  Medications Ordered in UC Medications - No data to display  Initial Impression /  Assessment and Plan / UC Course  I have reviewed the triage vital signs and the nursing notes.  Pertinent labs & imaging results that were available during my care of the patient were reviewed by me and considered in my medical decision making (see chart for details).     Patient is a 89 y.o.  male who presents after right eye lesion for the past 3 weeks.  On exam, he has evidence of right lower external hordeolum.  Treat with erythromycin ointment.  Advised to follow-up with an ophthalmologist, if  discomfort/pain is not improving after 7day course. Understanding voiced.   Discussed MDM, treatment plan and plan for follow-up with patient and his wife who agree with plan.  Final Clinical Impressions(s) / UC Diagnoses   Final diagnoses:  Hordeolum externum of right lower eyelid     Discharge Instructions      Stop by the pharmacy to pick up your antibiotic eye medication.  Follow up with your primary eyecare provider or [facility] if symptoms suddenly worsen or you have little improvement in your eye symptoms.       ED Prescriptions     Medication Sig Dispense Auth. Provider   erythromycin ophthalmic ointment Place a 1/2 inch ribbon of ointment into the lower eyelid 3 times day for 7 days. 3.5 g Katha Cabal, DO      PDMP not reviewed this encounter.   Katha Cabal, DO 10/10/23 [id number]

## 2023-10-08 NOTE — Discharge Instructions (Signed)
Stop by the pharmacy to pick up your antibiotic eye medication.  Follow up with your primary eyecare provider or [facility] if symptoms suddenly worsen or you have little improvement in your eye symptoms.    

## 2023-10-08 NOTE — ED Triage Notes (Signed)
Patient c/o sty under his right eye for three week.  Patient states that the bump has become tender and sore.

## 2023-12-27 ENCOUNTER — Other Ambulatory Visit: Payer: Self-pay

## 2023-12-27 DIAGNOSIS — N138 Other obstructive and reflux uropathy: Secondary | ICD-10-CM

## 2023-12-27 DIAGNOSIS — N401 Enlarged prostate with lower urinary tract symptoms: Secondary | ICD-10-CM

## 2023-12-27 DIAGNOSIS — R351 Nocturia: Secondary | ICD-10-CM

## 2023-12-27 MED ORDER — TAMSULOSIN HCL 0.4 MG PO CAPS: 0.4000 mg | ORAL_CAPSULE | Freq: Every day | ORAL | 3 refills | Status: DC

## 2024-02-02 ENCOUNTER — Other Ambulatory Visit: Payer: Self-pay

## 2024-02-02 DIAGNOSIS — R351 Nocturia: Secondary | ICD-10-CM

## 2024-02-02 DIAGNOSIS — N401 Enlarged prostate with lower urinary tract symptoms: Secondary | ICD-10-CM

## 2024-02-02 MED ORDER — TAMSULOSIN HCL 0.4 MG PO CAPS: 0.4000 mg | ORAL_CAPSULE | Freq: Every day | ORAL | 2 refills | Status: DC

## 2024-02-02 NOTE — Telephone Encounter (Signed)
 RX changed to local pharmacy per request.

## 2024-02-06 ENCOUNTER — Ambulatory Visit
Admission: EM | Admit: 2024-02-06 | Discharge: 2024-02-06 | Disposition: A | Discharge status: Home / Self Care | Attending: Emergency Medicine | Admitting: Emergency Medicine

## 2024-02-06 ENCOUNTER — Encounter: Payer: Self-pay | Admitting: Emergency Medicine

## 2024-02-06 DIAGNOSIS — J069 Acute upper respiratory infection, unspecified: Secondary | ICD-10-CM | POA: Diagnosis present

## 2024-02-06 LAB — RESP PANEL BY RT-PCR (FLU A&B, COVID) ARPGX2
Influenza A by PCR: NEGATIVE
Influenza B by PCR: NEGATIVE
SARS Coronavirus 2 by RT PCR: NEGATIVE

## 2024-02-06 MED ORDER — PROMETHAZINE-DM 6.25-15 MG/5ML PO SYRP: 5.0000 mL | ORAL_SOLUTION | Freq: Four times a day (QID) | ORAL | 0 refills | Status: DC | PRN

## 2024-02-06 MED ORDER — IPRATROPIUM BROMIDE 0.06 % NA SOLN: 2.0000 | Freq: Four times a day (QID) | NASAL | 12 refills | Status: DC

## 2024-02-06 MED ORDER — BENZONATATE 100 MG PO CAPS: 200.0000 mg | ORAL_CAPSULE | Freq: Three times a day (TID) | ORAL | 0 refills | Status: DC

## 2024-02-06 NOTE — Discharge Instructions (Addendum)
 Your testing today was negative for COVID or influenza.  I do believe you have a respiratory virus which is causing your symptoms.  Please use over-the-counter Tylenol as needed for any fever or pain.  Use the Atrovent nasal spray, 2 squirts in each nostril every 6 hours, as needed for runny nose and postnasal drip.  Use the Tessalon Perles every 8 hours during the day.  Take them with a small sip of water.  They may give you some numbness to the base of your tongue or a metallic taste in your mouth, this is normal.  Use the Promethazine DM cough syrup at bedtime for cough and congestion.  It will make you drowsy so do not take it during the day.  Return for reevaluation or see your primary care provider for any new or worsening symptoms.

## 2024-02-06 NOTE — ED Triage Notes (Signed)
 Pt presents with a cough and nasal congestion x 4 days. Pt has taken Mucinex for his symptoms.

## 2024-02-06 NOTE — ED Provider Notes (Signed)
 [facility]    CSN: [id number] Arrival date & time: 02/06/24  1138      History   Chief Complaint Chief Complaint  Patient presents with   Cough   Nasal Congestion    HPI Kenneth Watson is a 89 y.o. male.   HPI  89 year old male with past medical history significant for atrial fibrillation, cardiomyopathy, CHF, GERD, hyperlipidemia, hypertension, neuropathy, squamous of carcinoma presents for evaluation of respiratory symptoms that began 4 days ago.  They are predominantly in his upper respiratory tract and he indicates that he has got some nasal congestion with slight runny nose and postnasal drip.  He very occasionally has a cough and is nonproductive.  He denies any fever, sore throat, ear pain, shortness breath, or wheezing.  His wife had similar symptoms.  Past Medical History:  Diagnosis Date   Atrial fibrillation (HCC)    Cancer (HCC) 2006   Squamous cell carcicoma   Cardiomyopathy (HCC)    Congestive heart failure (CHF) (HCC)    GERD (gastroesophageal reflux disease)    Hyperlipidemia    Hypertension    Neuropathy     Patient Active Problem List   Diagnosis Date Noted   Squamous cell carcinoma 03/26/2022   V-tach (HCC) 03/09/2022   Cervical facet syndrome 10/25/2021   Personal history of other malignant neoplasm of skin 08/18/2021   Bilateral carotid artery disease (HCC) 12/16/2020   Moderate mitral regurgitation 11/18/2020   Chronic systolic CHF (congestive heart failure), NYHA class 2 (HCC) 11/27/2017   Bilateral hearing loss 10/25/2017   Bilateral impacted cerumen 10/25/2017   Chronic hand pain, right 04/04/2017   Primary osteoarthritis of right hand 04/04/2017   Essential hypertension 03/23/2017   Gastroesophageal reflux disease 03/23/2017   Nasal congestion with rhinorrhea 03/23/2017   ED (erectile dysfunction) of organic origin 06/28/2016   Frequent PVCs 05/19/2015   Moderate aortic valve insufficiency 05/12/2015   Moderate tricuspid  insufficiency 01/07/2015   Chronic a-fib (HCC) 09/03/2014   Dermatochalasis 02/25/2014   Mechanical ptosis 01/21/2014   Pseudophakia of both eyes 04/12/2013   Bicipital tenosynovitis 12/28/2012   Rotator cuff tear 11/02/2012   Hearing loss, sensorineural, asymmetrical 10/24/2012   BPH (benign prostatic hyperplasia) 09/25/2012   Shoulder sprain 09/17/2012   Hyperlipidemia, mixed 06/03/2011   Low back pain 06/03/2011   Nocturia 06/03/2011    Past Surgical History:  Procedure Laterality Date   FRACTURE SURGERY  89 yrs old   elbow   HERNIA REPAIR     VASECTOMY         Home Medications    Prior to Admission medications   Medication Sig Start Date End Date Taking? Authorizing Provider  benzonatate (TESSALON) 100 MG capsule Take 2 capsules (200 mg total) by mouth every 8 (eight) hours. 02/06/24  Yes Becky Augusta, NP  ipratropium (ATROVENT) 0.06 % nasal spray Place 2 sprays into both nostrils 4 (four) times daily. 02/06/24  Yes Becky Augusta, NP  promethazine-dextromethorphan (PROMETHAZINE-DM) 6.25-15 MG/5ML syrup Take 5 mLs by mouth 4 (four) times daily as needed. 02/06/24  Yes Becky Augusta, NP  ammonium lactate (AMLACTIN) 12 % cream Apply topically. 02/24/22   [provider]  apixaban (ELIQUIS) 5 MG TABS tablet Take 1 tablet by mouth 2 (two) times daily. 03/22/23   [provider]  erythromycin ophthalmic ointment Place a 1/2 inch ribbon of ointment into the lower eyelid 3 times day for 7 days. 10/08/23   Brimage, Seward Meth, DO  Flaxseed Oil (LINSEED OIL) OIL Use 1  capsule once daily    [provider]  fluticasone-salmeterol (ADVAIR) 100-50 MCG/ACT AEPB Inhale into the lungs. 03/02/23   [provider]  lisinopril (ZESTRIL) 2.5 MG tablet Take by mouth. 10/08/19   [provider]  metoprolol succinate (TOPROL-XL) 25 MG 24 hr tablet Take 25 mg by mouth daily. 03/28/18   [provider]  montelukast (SINGULAIR) 10 MG tablet  08/10/18   [provider]  polyethylene glycol (MIRALAX / GLYCOLAX) 17 g packet Take 17 g by mouth daily as needed for mild constipation.    [provider]  senna-docusate (SENOKOT-S) 8.6-50 MG tablet Take 1 tablet by mouth daily as needed for mild constipation.    [provider]  tamsulosin (FLOMAX) 0.4 MG CAPS capsule Take 1 capsule (0.4 mg total) by mouth daily. 02/02/24   Sondra Come, MD    Family History Family History  Problem Relation Age of Onset   Heart attack Mother    Uterine cancer Mother    Kidney failure Father     Social History Social History   Tobacco Use   Smoking status: Never    Passive exposure: Never   Smokeless tobacco: Never  Vaping Use   Vaping status: Never Used  Substance Use Topics   Alcohol use: No   Drug use: No     Allergies   Elemental sulfur, Septra [sulfamethoxazole-trimethoprim], and Statins   Review of Systems Review of Systems  Constitutional:  Negative for fever.  HENT:  Positive for congestion and postnasal drip. Negative for ear pain, rhinorrhea and sore throat.   Respiratory:  Positive for cough. Negative for shortness of breath and wheezing.      Physical Exam Triage Vital Signs ED Triage Vitals  Encounter Vitals Group     BP 02/06/24 1221 126/72     Systolic BP Percentile --      Diastolic BP Percentile --      Pulse Rate 02/06/24 1221 76     Resp 02/06/24 1221 16     Temp 02/06/24 1221 97.7 F (36.5 C)     Temp Source 02/06/24 1221 Oral     SpO2 02/06/24 1221 99 %     Weight --      Height --      Head Circumference --      Peak Flow --      Pain Score 02/06/24 1220 0     Pain Loc --      Pain Education --      Exclude from Growth Chart --    No data found.  Updated Vital Signs BP 126/72   Pulse 76   Temp 97.7 F (36.5 C) (Oral)   Resp 16   SpO2 99%   Visual Acuity Right Eye Distance:   Left Eye Distance:   Bilateral Distance:    Right Eye Near:   Left Eye Near:    Bilateral  Near:     Physical Exam Vitals and nursing note reviewed.  Constitutional:      Appearance: Normal appearance. He is not ill-appearing.  HENT:     Head: Normocephalic and atraumatic.     Right Ear: Tympanic membrane, ear canal and external ear normal. There is no impacted cerumen.     Left Ear: Tympanic membrane, ear canal and external ear normal. There is no impacted cerumen.     Nose: Congestion and rhinorrhea present.     Comments: Nasal mucosa is erythematous and mildly edematous with scant  clear discharge in both nares.    Mouth/Throat:     Mouth: Mucous membranes are moist.     Pharynx: Oropharynx is clear. No oropharyngeal exudate or posterior oropharyngeal erythema.  Cardiovascular:     Rate and Rhythm: Normal rate and regular rhythm.     Pulses: Normal pulses.     Heart sounds: Normal heart sounds. No murmur heard.    No friction rub. No gallop.  Pulmonary:     Effort: Pulmonary effort is normal.     Breath sounds: Normal breath sounds. No wheezing, rhonchi or rales.  Musculoskeletal:     Cervical back: Normal range of motion and neck supple. No tenderness.  Lymphadenopathy:     Cervical: No cervical adenopathy.  Skin:    General: Skin is warm and dry.     Capillary Refill: Capillary refill takes less than 2 seconds.  Neurological:     General: No focal deficit present.     Mental Status: He is alert and oriented to person, place, and time.      UC Treatments / Results  Labs (all labs ordered are listed, but only abnormal results are displayed) Labs Reviewed  RESP PANEL BY RT-PCR (FLU A&B, COVID) [id number]    EKG   Radiology No results found.  Procedures Procedures (including critical care time)  Medications Ordered in UC Medications - No data to display  Initial Impression / Assessment and Plan / UC Course  I have reviewed the triage vital signs and the nursing notes.  Pertinent labs & imaging results that were available during my care of the patient  were reviewed by me and considered in my medical decision making (see chart for details).   Patient is a pleasant, nontoxic-appearing 89 year old male presenting for evaluation of 4 days with respiratory symptoms outlined HPI above.  Patient's predominant complaint is nasal congestion and postnasal drip.  He states he only coughs up mucus if he has an accumulation of his postnasal drip and his cough is very infrequent.  No shortness breath or wheezing.  He is able to speak in full sentence with dyspnea or tachypnea.  Room air oxygen saturation is 99% with a respiratory of 16 at triage.  He is afebrile with an oral temp of 97.7.  His physical exam does reveal inflammation of his upper respiratory tract as evidenced by inflamed nasal mucosa.  His nasal discharge is clear.  No posterior oropharyngeal erythema appreciated.  There is clear postnasal drip present.  No cervical lymphadenopathy present exam.  Differential diagnose include COVID, influenza, viral respiratory illness.  I will order a COVID and influenza PCR.  COVID and flu PCR negative.  I will discharge patient with diagnosis of viral URI with a cough with a prescription for Atrovent nasal spray to nasal congestion.  Tessalon Perles and Promethazine DM cough syrup for cough and congestion.  Return precautions reviewed.   Final Clinical Impressions(s) / UC Diagnoses   Final diagnoses:  Viral URI with cough     Discharge Instructions      Your testing today was negative for COVID or influenza.  I do believe you have a respiratory virus which is causing your symptoms.  Please use over-the-counter Tylenol as needed for any fever or pain.  Use the Atrovent nasal spray, 2 squirts in each nostril every 6 hours, as needed for runny nose and postnasal drip.  Use the Tessalon Perles every 8 hours during the day.  Take them with a small sip of water.  They may give you some numbness to the base of your tongue or a metallic taste in your mouth,  this is normal.  Use the Promethazine DM cough syrup at bedtime for cough and congestion.  It will make you drowsy so do not take it during the day.  Return for reevaluation or see your primary care provider for any new or worsening symptoms.      ED Prescriptions     Medication Sig Dispense Auth. Provider   benzonatate (TESSALON) 100 MG capsule Take 2 capsules (200 mg total) by mouth every 8 (eight) hours. 21 capsule Becky Augusta, NP   ipratropium (ATROVENT) 0.06 % nasal spray Place 2 sprays into both nostrils 4 (four) times daily. 15 mL Becky Augusta, NP   promethazine-dextromethorphan (PROMETHAZINE-DM) 6.25-15 MG/5ML syrup Take 5 mLs by mouth 4 (four) times daily as needed. 118 mL Becky Augusta, NP      PDMP not reviewed this encounter.   Becky Augusta, NP 02/06/24 1415

## 2024-02-13 ENCOUNTER — Encounter: Payer: Self-pay | Admitting: Emergency Medicine

## 2024-02-13 ENCOUNTER — Ambulatory Visit
Admission: EM | Admit: 2024-02-13 | Discharge: 2024-02-13 | Disposition: A | Discharge status: Home / Self Care | Attending: Emergency Medicine | Admitting: Emergency Medicine

## 2024-02-13 DIAGNOSIS — J0140 Acute pansinusitis, unspecified: Secondary | ICD-10-CM

## 2024-02-13 MED ORDER — DOXYCYCLINE HYCLATE 100 MG PO CAPS: 100.0000 mg | ORAL_CAPSULE | Freq: Two times a day (BID) | ORAL | 0 refills | Status: AC

## 2024-02-13 MED ORDER — FLUTICASONE PROPIONATE 50 MCG/ACT NA SUSP: 2.0000 | Freq: Every day | NASAL | 0 refills | Status: DC

## 2024-02-13 NOTE — ED Triage Notes (Signed)
 Pt was seen here on 02/06/24 and is not better. He states he has head congestion and a sore throat x 1 week.

## 2024-02-13 NOTE — Discharge Instructions (Signed)
 Start Mucinex to keep the mucous thin and to decongest you.   You may take 1000 mg of tylenol up to 3-4 times a day as needed for pain. Use a NeilMed sinus rinse with distilled water as often as you want to to reduce nasal congestion. Follow the directions on the box.  The Atrovent.  Start Flonase.  Go to www.goodrx.com to look up your medications. This will give you a list of where you can find your prescriptions at the most affordable prices. Or you can ask the pharmacist what the cash price is. This is frequently cheaper than going through insurance.

## 2024-02-13 NOTE — ED Provider Notes (Signed)
 HPI  SUBJECTIVE:  Kenneth Watson is a 89 y.o. male who presents with 1 week of nasal congestion described as "a stuffy head", and new sore throat, postnasal drip.  States that he can breathe through his nose.  He denies fevers, sinus pain or pressure, rhinorrhea, facial swelling, upper dental pain, cough.  No change in his baseline wheezing, shortness of breath.  No GERD or allergy symptoms.  He reports getting better and then getting worse.  No antibiotics in the past month.  He was seen here on 3/18 for 4 days of URI symptoms.  COVID, flu negative.  He was thought to have a viral URI with a cough, sent home with Atrovent nasal spray, Tessalon and Promethazine DM which he states that he has been taking without improvement in his symptoms.  He has also been taking 2-3 Tylenol twice a day.  His last dose of Tylenol was within the past 6 hours.  No aggravating factors. He has a past medical history of atrial fibrillation/V. tach on Eliquis, squamous cell carcinoma, cardiomyopathy, congestive heart failure, GERD, hyperlipidemia, hypertension, bilateral carotid artery disease.  PCP: [facility] [place].  Additional history obtained from family member  Past Medical History:  Diagnosis Date   Atrial fibrillation (HCC)    Cancer (HCC) 2006   Squamous cell carcicoma   Cardiomyopathy (HCC)    Congestive heart failure (CHF) (HCC)    GERD (gastroesophageal reflux disease)    Hyperlipidemia    Hypertension    Neuropathy     Past Surgical History:  Procedure Laterality Date   FRACTURE SURGERY  89 yrs old   elbow   HERNIA REPAIR     VASECTOMY      Family History  Problem Relation Age of Onset   Heart attack Mother    Uterine cancer Mother    Kidney failure Father     Social History   Tobacco Use   Smoking status: Never    Passive exposure: Never   Smokeless tobacco: Never  Vaping Use   Vaping status: Never Used  Substance Use Topics   Alcohol use: No   Drug use: No    No  current facility-administered medications for this encounter.  Current Outpatient Medications:    doxycycline (VIBRAMYCIN) 100 MG capsule, Take 1 capsule (100 mg total) by mouth 2 (two) times daily for 10 days., Disp: 20 capsule, Rfl: 0   fluticasone (FLONASE) 50 MCG/ACT nasal spray, Place 2 sprays into both nostrils daily., Disp: 16 g, Rfl: 0   ammonium lactate (AMLACTIN) 12 % cream, Apply topically., Disp: , Rfl:    apixaban (ELIQUIS) 5 MG TABS tablet, Take 1 tablet by mouth 2 (two) times daily., Disp: , Rfl:    erythromycin ophthalmic ointment, Place a 1/2 inch ribbon of ointment into the lower eyelid 3 times day for 7 days., Disp: 3.5 g, Rfl: 0   Flaxseed Oil (LINSEED OIL) OIL, Use 1 capsule once daily, Disp: , Rfl:    fluticasone-salmeterol (ADVAIR) 100-50 MCG/ACT AEPB, Inhale into the lungs., Disp: , Rfl:    lisinopril (ZESTRIL) 2.5 MG tablet, Take by mouth., Disp: , Rfl:    metoprolol succinate (TOPROL-XL) 25 MG 24 hr tablet, Take 25 mg by mouth daily., Disp: , Rfl:    montelukast (SINGULAIR) 10 MG tablet, , Disp: , Rfl:    polyethylene glycol (MIRALAX / GLYCOLAX) 17 g packet, Take 17 g by mouth daily as needed for mild constipation., Disp: , Rfl:    senna-docusate (SENOKOT-S) 8.6-50  MG tablet, Take 1 tablet by mouth daily as needed for mild constipation., Disp: , Rfl:    tamsulosin (FLOMAX) 0.4 MG CAPS capsule, Take 1 capsule (0.4 mg total) by mouth daily., Disp: 90 capsule, Rfl: 2  Allergies  Allergen Reactions   Elemental Sulfur Hives   Septra [Sulfamethoxazole-Trimethoprim] Hives   Statins Other (See Comments)    Memory issues     ROS  As noted in HPI.   Physical Exam  BP 122/78 (BP Location: Left Arm)   Pulse 81   Temp 98.1 F (36.7 C) (Oral)   Resp 16   SpO2 96%   Constitutional: Well developed, well nourished, no acute distress Eyes: PERRL, EOMI, conjunctiva normal bilaterally HENT: Normocephalic, atraumatic,mucus membranes moist.  Purulent nasal congestion.  No  maxillary, frontal sinus tenderness.  Normal turbinates.  Tonsils surgically absent.  Normal oropharynx.  Positive postnasal drip. Neck: No cervical lymphadenopathy Respiratory: Clear to auscultation bilaterally, no rales, no wheezing, no rhonchi Cardiovascular: Normal rate and rhythm, no murmurs, no gallops, no rubs GI: nondistended skin: No rash, skin intact Musculoskeletal: no deformities Neurologic: Alert & oriented x 3, CN III-XII grossly intact, no motor deficits, sensation grossly intact Psychiatric: Speech and behavior appropriate   ED Course   Medications - No data to display  No orders of the defined types were placed in this encounter.  No results found for this or any previous visit (from the past 24 hours). No results found.  ED Clinical Impression  1. Acute non-recurrent pansinusitis      ED Assessment/Plan     Patient presents with an upper respiratory infection that has now turned into a sinusitis.  Qualifies for antibiotics due to double sickening.  Home with doxycycline for 10 days as I do not know his kidney function, Mucinex, saline nasal irrigation, Flonase.  Discontinue Atrovent nasal spray.  Discussed  MDM, treatment plan, and plan for follow-up with patient . patient agrees with plan.   Meds ordered this encounter  Medications   fluticasone (FLONASE) 50 MCG/ACT nasal spray    Sig: Place 2 sprays into both nostrils daily.    Dispense:  16 g    Refill:  0   doxycycline (VIBRAMYCIN) 100 MG capsule    Sig: Take 1 capsule (100 mg total) by mouth 2 (two) times daily for 10 days.    Dispense:  20 capsule    Refill:  0      *This clinic note was created using Scientist, clinical (histocompatibility and immunogenetics). Therefore, there may be occasional mistakes despite careful proofreading. ?    Domenick Gong, MD 02/15/24 1248

## 2024-02-27 NOTE — Progress Notes (Signed)
 Established Patient Visit   Chief Complaint: FU HF, Afib  Date of Service: 02/27/2024 Date of Birth: Feb 02, 1931 PCP: Eliverto Bette Hover, MD  History of Present Illness: Kenneth Watson is a 89 y.o.male patient who presented for follow-up of heart failure, atrial fibrillation.   Past medical history significant for heart failure with reduced EF thought to be related to A-fib with improved LVEF, chronic atrial fibrillation since 2011, moderate MR/TR, hypertension, frequent PVCs/short NSVT.  Last echo 02/2024 with mildly reduced LVEF of 50%, normal RV systolic function, moderate MR/TR, mild AR.  Stress test 02/2022 without any ischemia.  Today patient is accompanied by Kenneth Watson to visit.  Details that he is doing well from cardiac standpoint.  Exercises 3-5 times a week, walks for 30 minutes at a time on plain ground without any complaints of chest pain/pressure or increased dyspnea.  If he tries to walk uphill, has exertional dyspnea which is stable.  Having hip/back pain issues.  No pedal edema.  No orthopnea.  No dizziness.  No syncope.    Past Medical and Surgical History  Past Medical History Past Medical History:  Diagnosis Date  . Actinic keratosis   . Allergic state    sulfur  . Arrhythmia 1999  . Arthritis 2004  . Atrial fibrillation (CMS/HHS-HCC)   . Bilateral carotid artery disease () 12/16/2020  . Bilateral hearing loss 10/25/2017  . BPH (benign prostatic hyperplasia)   . Cardiomyopathy (CMS/HHS-HCC)   . CHF (congestive heart failure) (CMS/HHS-HCC) 2002  . Constipation 07/13/2022  . COVID-19 08/2019  . COVID-19 11/2021  . GERD (gastroesophageal reflux disease) 2008   medication  . Heart disease 2012  . Hyperlipidemia   . Hypertension   . Microscopic hematuria   . Neuropathy   . Squamous cell carcinoma 2006   skin on back   . V-tach (CMS/HHS-HCC) 03/09/2022   7beat run on holter which was asymtomatic  . VHD (valvular heart disease)     Past Surgical History He has a past  surgical history that includes multiple prostate biopsies; S/P DIRECT INGUINAL HERNIA REPAIR LEFT; Inguinal hernia repair (Bilateral, [id number]) ~1998(L)); Fracture surgery (89 years old); Knee arthroscopy; Prostate surgery; Tonsillectomy; Vasectomy; blepharoplasty upper eyelid (Bilateral, 02/25/14); extraction cataract extracapsular w/insertion intraocular prosthesis (Left, 01/14/08); extraction cataract extracapsular w/insertion intraocular prosthesis (Right, 03/03/08); and Sinus surgery.   Medications and Allergies  Current Medications  Current Outpatient Medications  Medication Sig Dispense Refill  . benzonatate  (TESSALON  ORAL) Take by mouth    . clobetasoL (TEMOVATE) 0.05 % cream Apply topically twice a day to affected areas for no more than two weeks at a time. Do not apply to face or skin folds. 45 g 4  . ELIQUIS  5 mg tablet TAKE 1 TABLET TWICE A DAY 180 tablet 3  . flaxseed oiL Oil Use 1 capsule once daily    . fluticasone  propion-salmeteroL (WIXELA INHUB) 100-50 mcg/dose diskus inhaler Inhale 1 Puff into the lungs every 12 (twelve) hours 60 each 0  . gabapentin  (NEURONTIN ) 100 MG capsule 1 capsule by mouth twice daily. 200 capsule 0  . hydroxychloroquine  (PLAQUENIL ) 200 mg tablet Take 1 tablet (200 mg total) by mouth once daily 90 tablet 1  . ketoconazole (NIZORAL) 2 % cream Apply topically twice a day to affected areas until rash clears 60 g 11  . LINZESS  72 mcg capsule TAKE 1 CAPSULE(72 MCG) BY MOUTH DAILY 30 capsule 5  . metoprolol  succinate (TOPROL -XL) 25 MG XL tablet Take 1 tablet (25 mg total) by  mouth once daily 90 tablet 3  . metoprolol  succinate (TOPROL -XL) 50 MG XL tablet Take 1 tablet (50 mg total) by mouth once daily 30 tablet 11  . montelukast  (SINGULAIR ) 10 mg tablet TAKE 1 TABLET AT BEDTIME 90 tablet 3  . polyethylene glycol (MIRALAX) packet Take 17 g by mouth once daily as needed for Constipation Mix in 4-8ounces of fluid prior to taking.    . propylene glycol/peg 400/PF  (SYSTANE, PF, OPHTH) Apply 1 drop to eye 3 (three) times daily as needed (dry eyes)    . sennosides-docusate (SENOKOT-S) 8.6-50 mg tablet Take 1 tablet by mouth once daily as needed for Constipation    . tamsulosin  (FLOMAX ) 0.4 mg capsule Take 0.4 mg by mouth once daily Take 30 minutes after same meal each day.     No current facility-administered medications for this visit.    Allergies: Sulfa (sulfonamide antibiotics), Aspirin, Septra [sulfamethoxazole-trimethoprim], and Statins-hmg-coa reductase inhibitors  Social and Family History  Social History  reports that he has never smoked. He has never been exposed to tobacco smoke. He has never used smokeless tobacco. He reports that he does not drink alcohol and does not use drugs.  Family History Family History  Problem Relation Name Age of Onset  . Myocardial Infarction (Heart attack) Mother Kenneth Watson   . Ovarian cancer Mother Kenneth Watson   . Cancer Mother Kenneth Watson   . Myocardial Infarction (Heart attack) Father    . Kidney failure Father    . Alcohol abuse Father    . Heart disease Brother    . Arthritis Other    . Heart disease Brother tom        deceased  . Prostate cancer Neg Hx    . Breast cancer Neg Hx    . Glaucoma Neg Hx    . Macular degeneration Neg Hx    . Diabetes Neg Hx    . High blood pressure (Hypertension) Neg Hx    . Blindness Neg Hx      Review of Systems   Review of Systems: T No chest pain No increased exertional dyspnea  Physical Examination   Vitals:BP 122/66   Ht 177.8 cm (5' 10)   Wt 72.9 kg (160 lb 12.8 oz)   SpO2 98%   BMI 23.07 kg/m  Ht:177.8 cm (5' 10) Wt:72.9 kg (160 lb 12.8 oz) ADJ:Anib surface area is 1.9 meters squared. Body mass index is 23.07 kg/m.  HEENT: Pupils equally reactive to light and accomodation  Neck: Supple, no significant JVD Lungs: clear to auscultation bilaterally; no wheezes, rales, rhonchi Heart: Irregular rhythm, normal rate.  Grade 2 systolic murmur left lower  sternal border Extremities: Kenneth pedal edema  Assessment and Plan   89 y.o. male with Chronic atrial fibrillation since 2011 Chronic systolic dysfunction with improved LVEF, LVEF 50% 02/2024 Moderate mitral regurgitation Moderate tricuspid regurgitation Frequent PVCs Hypertension  Patient stable from cardiac standpoint.  Has baseline NYHA class II exertional dyspnea.  Euvolemic on exam.  Blood pressure and heart rate well-controlled. Recent echocardiogram with overall preserved LVEF of 50% and stable moderate MR/TR. Continue Toprol -XL.   Continue Eliquis  for anticoagulation with elevated [place] Vasc score.    No orders of the defined types were placed in this encounter.   Return in about 6 months (around 08/28/2024).  KRISHNA CHAITANYA ALLURI, MD  This dictation was prepared with dragon dictation. Any transcription errors that result from this process are unintentional.

## 2024-03-10 ENCOUNTER — Ambulatory Visit
Admission: EM | Admit: 2024-03-10 | Discharge: 2024-03-10 | Disposition: A | Discharge status: Home / Self Care | Attending: Family Medicine | Admitting: Family Medicine

## 2024-03-10 DIAGNOSIS — S61217A Laceration without foreign body of left little finger without damage to nail, initial encounter: Secondary | ICD-10-CM | POA: Diagnosis not present

## 2024-03-10 DIAGNOSIS — Z23 Encounter for immunization: Secondary | ICD-10-CM

## 2024-03-10 MED ORDER — TETANUS-DIPHTH-ACELL PERTUSSIS 5-2.5-18.5 LF-MCG/0.5 IM SUSY
0.5000 mL | PREFILLED_SYRINGE | Freq: Once | INTRAMUSCULAR | Status: AC
  Administered 2024-03-10: 0.5 mL via INTRAMUSCULAR

## 2024-03-10 MED ORDER — ACETAMINOPHEN 325 MG PO TABS
975.0000 mg | ORAL_TABLET | Freq: Once | ORAL | Status: AC
  Administered 2024-03-10: 975 mg via ORAL

## 2024-03-10 NOTE — ED Triage Notes (Signed)
 Last night cut pinky on his left hand while cutting a tomato  Taking Eliquis  Finger keeps bleeding

## 2024-03-10 NOTE — ED Provider Notes (Signed)
 [facility]    CSN: [id number] Arrival date & time: 03/10/24  1421      History   Chief Complaint Chief Complaint  Patient presents with   Finger Injury    HPI Kenneth Watson is a 89 y.o. male.   HPI Provided by patient and his wife Asia presents for left little finger laceration that occurred yesterday around 4 PM while slicing tomatoes for a salad with a sharp knife.  He takes Eliquis and occasionally takes BC powder for pain.  They wrapped the finger to control the bleeding.  After charge today, they took the bandage off and the finger started bleeding all over again.  Marck has otherwise been well and has no other concerns.    Past Medical History:  Diagnosis Date   Atrial fibrillation (HCC)    Cancer (HCC) 2006   Squamous cell carcicoma   Cardiomyopathy (HCC)    Congestive heart failure (CHF) (HCC)    GERD (gastroesophageal reflux disease)    Hyperlipidemia    Hypertension    Neuropathy     Patient Active Problem List   Diagnosis Date Noted   Squamous cell carcinoma 03/26/2022   V-tach (HCC) 03/09/2022   Cervical facet syndrome 10/25/2021   Personal history of other malignant neoplasm of skin 08/18/2021   Bilateral carotid artery disease (HCC) 12/16/2020   Moderate mitral regurgitation 11/18/2020   Chronic systolic CHF (congestive heart failure), NYHA class 2 (HCC) 11/27/2017   Bilateral hearing loss 10/25/2017   Bilateral impacted cerumen 10/25/2017   Chronic hand pain, right 04/04/2017   Primary osteoarthritis of right hand 04/04/2017   Essential hypertension 03/23/2017   Gastroesophageal reflux disease 03/23/2017   Nasal congestion with rhinorrhea 03/23/2017   ED (erectile dysfunction) of organic origin 06/28/2016   Frequent PVCs 05/19/2015   Moderate aortic valve insufficiency 05/12/2015   Moderate tricuspid insufficiency 01/07/2015   Chronic a-fib (HCC) 09/03/2014   Dermatochalasis 02/25/2014   Mechanical ptosis 01/21/2014    Pseudophakia of both eyes 04/12/2013   Bicipital tenosynovitis 12/28/2012   Rotator cuff tear 11/02/2012   Hearing loss, sensorineural, asymmetrical 10/24/2012   BPH (benign prostatic hyperplasia) 09/25/2012   Shoulder sprain 09/17/2012   Hyperlipidemia, mixed 06/03/2011   Low back pain 06/03/2011   Nocturia 06/03/2011    Past Surgical History:  Procedure Laterality Date   FRACTURE SURGERY  89 yrs old   elbow   HERNIA REPAIR     VASECTOMY         Home Medications    Prior to Admission medications   Medication Sig Start Date End Date Taking? Authorizing Provider  ammonium lactate (AMLACTIN) 12 % cream Apply topically. 02/24/22   [provider]  apixaban (ELIQUIS) 5 MG TABS tablet Take 1 tablet by mouth 2 (two) times daily. 03/22/23   [provider]  erythromycin  ophthalmic ointment Place a 1/2 inch ribbon of ointment into the lower eyelid 3 times day for 7 days. 10/08/23   Jakeline Dave, DO  Flaxseed Oil (LINSEED OIL) OIL Use 1 capsule once daily    [provider]  fluticasone  (FLONASE ) 50 MCG/ACT nasal spray Place 2 sprays into both nostrils daily. 02/13/24   Mortenson, Ashley, MD  fluticasone -salmeterol (ADVAIR) 100-50 MCG/ACT AEPB Inhale into the lungs. 03/02/23   [provider]  lisinopril (ZESTRIL) 2.5 MG tablet Take by mouth. 10/08/19   [provider]  metoprolol succinate (TOPROL-XL) 25 MG 24 hr tablet Take 25 mg by mouth daily. 03/28/18   [provider]  montelukast (SINGULAIR) 10 MG tablet  08/10/18   [provider]  polyethylene glycol (MIRALAX / GLYCOLAX) 17 g packet Take 17 g by mouth daily as needed for mild constipation.    [provider]  senna-docusate (SENOKOT-S) 8.6-50 MG tablet Take 1 tablet by mouth daily as needed for mild constipation.    [provider]  tamsulosin  (FLOMAX ) 0.4 MG CAPS capsule Take 1 capsule (0.4 mg total) by mouth daily. 02/02/24   Lawerence Pressman, MD     Family History Family History  Problem Relation Age of Onset   Heart attack Mother    Uterine cancer Mother    Kidney failure Father     Social History Social History   Tobacco Use   Smoking status: Never    Passive exposure: Never   Smokeless tobacco: Never  Vaping Use   Vaping status: Never Used  Substance Use Topics   Alcohol use: No   Drug use: No     Allergies   Elemental sulfur, Septra [sulfamethoxazole-trimethoprim], and Statins   Review of Systems Review of Systems :negative unless otherwise stated in HPI.      Physical Exam Triage Vital Signs ED Triage Vitals  Encounter Vitals Group     BP 03/10/24 1442 96/61     Systolic BP Percentile --      Diastolic BP Percentile --      Pulse Rate 03/10/24 1442 80     Resp 03/10/24 1442 18     Temp 03/10/24 1442 (!) 97.5 F (36.4 C)     Temp Source 03/10/24 1442 Oral     SpO2 03/10/24 1442 95 %     Weight --      Height --      Head Circumference --      Peak Flow --      Pain Score 03/10/24 1440 7     Pain Loc --      Pain Education --      Exclude from Growth Chart --    No data found.  Updated Vital Signs BP 96/61 (BP Location: Right Arm)   Pulse 80   Temp (!) 97.5 F (36.4 C) (Oral)   Resp 18   SpO2 95%   Visual Acuity Right Eye Distance:   Left Eye Distance:   Bilateral Distance:    Right Eye Near:   Left Eye Near:    Bilateral Near:     Physical Exam  GEN: alert, well appearing male, in no acute distress  CV: regular rate, strong radial pulse  RESP: no increased work of breathing MSK: 5th finger avulsion laceration at distal tip, bleeding, normal ROM of finger, no other injuries, no bony irregularity seen  NEURO: alert, moves all extremities appropriately SKIN: warm and dry; type I avulsion laceration of the left little finger  UC Treatments / Results  Labs (all labs ordered are listed, but only abnormal results are displayed) Labs Reviewed - No data to  display  EKG   Radiology No results found.  Procedures Procedures (including critical care time)  Medications Ordered in UC Medications  Tdap (BOOSTRIX) injection 0.5 mL (0.5 mLs Intramuscular Given 03/10/24 1502)  acetaminophen  (TYLENOL ) tablet 975 mg (975 mg Oral Given 03/10/24 1501)    Initial Impression / Assessment and Plan / UC Course  I have reviewed the triage vital signs and the nursing notes.  Pertinent labs & imaging results that were available during my care of the patient were reviewed  by me and considered in my medical decision making (see chart for details).     Pt is a 89 y.o. male who presents after injuring right little finger at home about 22 hours ago.Tylenol  given for pain.  Avulsion finger laceration was not repaired but wound cleaned with chlorhexidine.  Bleeding controlled with silver nitrate.  Home care instructions provided. OTC analgesics as needed for pain.   Tetanus updated prior to discharge. Pt's last tentaus was 03/24/2017.     Final Clinical Impressions(s) / UC Diagnoses   Final diagnoses:  Laceration of left little finger, foreign body presence unspecified, nail damage status unspecified, initial encounter     Discharge Instructions      Your tetanus vaccine was given today.  Let your primary care provider known.   Apply pressure dressing, if the finger starts to bleed again.   Take Tylenol  as needed for pain.       ED Prescriptions   None    PDMP not reviewed this encounter.              Fidel Huddle, DO 03/10/24 1522

## 2024-03-10 NOTE — Discharge Instructions (Addendum)
 Your tetanus vaccine was given today.  Let your primary care provider known.   Apply pressure dressing, if the finger starts to bleed again.   Take Tylenol  as needed for pain.

## 2024-03-10 NOTE — ED Notes (Signed)
 Discussed wound care and signs and symptoms needing evaluation

## 2024-05-12 NOTE — Progress Notes (Unsigned)
 05/13/2024 9:28 AM   Carlin Grace Nov 14, 1931 [id number]  Referring provider: Eliverto Bette Hover, MD [address] [place],  [place] [address]  Urological history: 1. BPH with nocturia - TURP ~ 20 years ago - tamsulosin  0.4 mg daily   Chief Complaint  Patient presents with   Follow-up   HPI: Kenneth Watson is a 89 y.o. man who presents today for yearly follow up.    Previous records reviewed.   PVR: 0 mL   Previous PVR: 0 mL    Major complaint(s): Nocturia 3-4 times and nighttime incontinence x many years. Denies any dysuria, hematuria or suprapubic pain.   Currently taking: Tamsulosin  0.4 mg daily.  He has had TURP ~ 20 years ago.   Denies any recent fevers, chills, nausea or vomiting.   Serum creatinine (03/2024) 0.9   PMH: Past Medical History:  Diagnosis Date   Atrial fibrillation (HCC)    Cancer (HCC) 2006   Squamous cell carcicoma   Cardiomyopathy (HCC)    Congestive heart failure (CHF) (HCC)    GERD (gastroesophageal reflux disease)    Hyperlipidemia    Hypertension    Neuropathy     Surgical History: Past Surgical History:  Procedure Laterality Date   FRACTURE SURGERY  89 yrs old   elbow   HERNIA REPAIR     VASECTOMY      Home Medications:  Allergies as of 05/13/2024       Reactions   Elemental Sulfur Hives   Septra [sulfamethoxazole-trimethoprim] Hives   Statins Other (See Comments)   Memory issues        Medication List        Accurate as of May 13, 2024  9:28 AM. If you have any questions, ask your nurse or doctor.          ammonium lactate 12 % cream Commonly known as: AMLACTIN Apply topically.   Eliquis 5 MG Tabs tablet Generic drug: apixaban Take 1 tablet by mouth 2 (two) times daily.   erythromycin  ophthalmic ointment Place a 1/2 inch ribbon of ointment into the lower eyelid 3 times day for 7 days.   fluticasone  50 MCG/ACT nasal spray Commonly known as: FLONASE  Place 2 sprays into both nostrils  daily.   fluticasone -salmeterol 100-50 MCG/ACT Aepb Commonly known as: ADVAIR Inhale into the lungs.   Linseed Oil Oil Use 1 capsule once daily   lisinopril 2.5 MG tablet Commonly known as: ZESTRIL Take by mouth.   metoprolol succinate 25 MG 24 hr tablet Commonly known as: TOPROL-XL Take 25 mg by mouth daily.   montelukast 10 MG tablet Commonly known as: SINGULAIR   polyethylene glycol 17 g packet Commonly known as: MIRALAX / GLYCOLAX Take 17 g by mouth daily as needed for mild constipation.   senna-docusate 8.6-50 MG tablet Commonly known as: Senokot-S Take 1 tablet by mouth daily as needed for mild constipation.   tamsulosin  0.4 MG Caps capsule Commonly known as: FLOMAX  Take 1 capsule (0.4 mg total) by mouth daily.        Allergies:  Allergies  Allergen Reactions   Elemental Sulfur Hives   Septra [Sulfamethoxazole-Trimethoprim] Hives   Statins Other (See Comments)    Memory issues    Family History: Family History  Problem Relation Age of Onset   Heart attack Mother    Uterine cancer Mother    Kidney failure Father     Social History:  reports that he has never smoked. He has never been exposed to  tobacco smoke. He has never used smokeless tobacco. He reports that he does not drink alcohol and does not use drugs.  ROS: Pertinent ROS in HPI  Physical Exam: BP 115/75 (BP Location: Left Arm, Patient Position: Sitting, Cuff Size: Normal)   Pulse 73   Ht 5' 10 (1.778 m)   Wt 157 lb 3.2 oz (71.3 kg)   SpO2 97%   BMI 22.56 kg/m   Constitutional:  Well nourished. Alert and oriented, No acute distress. HEENT: [place] AT, moist mucus membranes.  Trachea midline Cardiovascular: No clubbing, cyanosis, or edema. Respiratory: Normal respiratory effort, no increased work of breathing. Neurologic: Grossly intact, no focal deficits, moving all 4 extremities. Psychiatric: Normal mood and affect.  Laboratory Data: See EPIC and HPI    Pertinent Imaging:   05/13/24 09:21  Scan Result 0ml    Assessment & Plan:    1. BPH with nocturia -PVR demonstrates adequate emptying  -continue tamsulosin  0.4 mg -continue with conservative management   Return in about 1 year (around 05/13/2025) for PVR .  These notes generated with voice recognition software. I apologize for typographical errors.  CLOTILDA HELON RIGGERS  [facility] [address]  [address] [place], [place] [address] [phone]

## 2024-05-13 ENCOUNTER — Ambulatory Visit (INDEPENDENT_AMBULATORY_CARE_PROVIDER_SITE_OTHER): Payer: Self-pay | Admitting: Urology

## 2024-05-13 ENCOUNTER — Encounter: Payer: Self-pay | Admitting: Urology

## 2024-05-13 VITALS — BP 115/75 | HR 73 | Ht 70.0 in | Wt 157.2 lb

## 2024-05-13 DIAGNOSIS — N138 Other obstructive and reflux uropathy: Secondary | ICD-10-CM | POA: Diagnosis not present

## 2024-05-13 DIAGNOSIS — R351 Nocturia: Secondary | ICD-10-CM

## 2024-05-13 DIAGNOSIS — N401 Enlarged prostate with lower urinary tract symptoms: Secondary | ICD-10-CM | POA: Diagnosis not present

## 2024-05-13 LAB — BLADDER SCAN AMB NON-IMAGING

## 2024-05-13 MED ORDER — TAMSULOSIN HCL 0.4 MG PO CAPS: 0.4000 mg | ORAL_CAPSULE | Freq: Every day | ORAL | 3 refills | Status: AC

## 2024-07-10 ENCOUNTER — Other Ambulatory Visit: Payer: Self-pay

## 2024-07-10 ENCOUNTER — Inpatient Hospital Stay
Admission: EM | Admit: 2024-07-10 | Discharge: 2024-07-13 | DRG: 281 | Disposition: A | Discharge status: Home / Self Care | Attending: Internal Medicine | Admitting: Internal Medicine

## 2024-07-10 ENCOUNTER — Emergency Department

## 2024-07-10 ENCOUNTER — Encounter: Payer: Self-pay | Admitting: Emergency Medicine

## 2024-07-10 ENCOUNTER — Observation Stay
Admit: 2024-07-10 | Discharge: 2024-07-10 | Disposition: A | Discharge status: Home / Self Care | Attending: Internal Medicine

## 2024-07-10 DIAGNOSIS — J189 Pneumonia, unspecified organism: Secondary | ICD-10-CM

## 2024-07-10 DIAGNOSIS — I4819 Other persistent atrial fibrillation: Secondary | ICD-10-CM | POA: Diagnosis present

## 2024-07-10 DIAGNOSIS — R079 Chest pain, unspecified: Principal | ICD-10-CM

## 2024-07-10 DIAGNOSIS — Z7951 Long term (current) use of inhaled steroids: Secondary | ICD-10-CM

## 2024-07-10 DIAGNOSIS — I214 Non-ST elevation (NSTEMI) myocardial infarction: Principal | ICD-10-CM | POA: Diagnosis present

## 2024-07-10 DIAGNOSIS — Z8249 Family history of ischemic heart disease and other diseases of the circulatory system: Secondary | ICD-10-CM

## 2024-07-10 DIAGNOSIS — I259 Chronic ischemic heart disease, unspecified: Secondary | ICD-10-CM

## 2024-07-10 DIAGNOSIS — I071 Rheumatic tricuspid insufficiency: Secondary | ICD-10-CM | POA: Diagnosis present

## 2024-07-10 DIAGNOSIS — Z1152 Encounter for screening for COVID-19: Secondary | ICD-10-CM

## 2024-07-10 DIAGNOSIS — Z7901 Long term (current) use of anticoagulants: Secondary | ICD-10-CM

## 2024-07-10 DIAGNOSIS — I110 Hypertensive heart disease with heart failure: Secondary | ICD-10-CM | POA: Diagnosis present

## 2024-07-10 DIAGNOSIS — M069 Rheumatoid arthritis, unspecified: Secondary | ICD-10-CM | POA: Diagnosis present

## 2024-07-10 DIAGNOSIS — E782 Mixed hyperlipidemia: Secondary | ICD-10-CM | POA: Diagnosis present

## 2024-07-10 DIAGNOSIS — I340 Nonrheumatic mitral (valve) insufficiency: Secondary | ICD-10-CM | POA: Diagnosis present

## 2024-07-10 DIAGNOSIS — Z79899 Other long term (current) drug therapy: Secondary | ICD-10-CM

## 2024-07-10 DIAGNOSIS — I5022 Chronic systolic (congestive) heart failure: Secondary | ICD-10-CM | POA: Diagnosis present

## 2024-07-10 DIAGNOSIS — I493 Ventricular premature depolarization: Secondary | ICD-10-CM | POA: Diagnosis present

## 2024-07-10 DIAGNOSIS — I482 Chronic atrial fibrillation, unspecified: Secondary | ICD-10-CM | POA: Diagnosis present

## 2024-07-10 DIAGNOSIS — K219 Gastro-esophageal reflux disease without esophagitis: Secondary | ICD-10-CM | POA: Diagnosis present

## 2024-07-10 DIAGNOSIS — E871 Hypo-osmolality and hyponatremia: Secondary | ICD-10-CM | POA: Diagnosis present

## 2024-07-10 LAB — BASIC METABOLIC PANEL WITH GFR
Anion gap: 8 (ref 5–15)
BUN: 11 mg/dL (ref 8–23)
CO2: 29 mmol/L (ref 22–32)
Calcium: 9.2 mg/dL (ref 8.9–10.3)
Chloride: 102 mmol/L (ref 98–111)
Creatinine, Ser: 0.77 mg/dL (ref 0.61–1.24)
GFR, Estimated: >60 mL/min (ref >60)
Glucose, Bld: 105 mg/dL — ABNORMAL HIGH (ref 70–99)
Potassium: 4.3 mmol/L (ref 3.5–5.1)
Sodium: 139 mmol/L (ref 135–145)

## 2024-07-10 LAB — TROPONIN I (HIGH SENSITIVITY)
Troponin I (High Sensitivity): 9 ng/L (ref <18)
Troponin I (High Sensitivity): 19 ng/L — ABNORMAL HIGH (ref <18)
Troponin I (High Sensitivity): 272 ng/L (ref <18)
Troponin I (High Sensitivity): 838 ng/L (ref <18)

## 2024-07-10 LAB — CBC
HCT: 38.4 % — ABNORMAL LOW (ref 39.0–52.0)
Hemoglobin: 12.8 g/dL — ABNORMAL LOW (ref 13.0–17.0)
MCH: 33.7 pg (ref 26.0–34.0)
MCHC: 33.3 g/dL (ref 30.0–36.0)
MCV: 101.1 fL — ABNORMAL HIGH (ref 80.0–100.0)
Platelets: 154 K/uL (ref 150–400)
RBC: 3.8 MIL/uL — ABNORMAL LOW (ref 4.22–5.81)
RDW: 12.6 % (ref 11.5–15.5)
WBC: 6.3 K/uL (ref 4.0–10.5)
nRBC: 0 % (ref 0.0–0.2)

## 2024-07-10 LAB — ECHOCARDIOGRAM COMPLETE
AR max vel: 2.91 cm2
AV Area VTI: 3.86 cm2
AV Area mean vel: 2.73 cm2
AV Mean grad: 1 mmHg
AV Peak grad: 2.4 mmHg
Ao pk vel: 0.78 m/s
Area-P 1/2: 1.85 cm2
Calc EF: 46.8 %
Height: 70 in
S' Lateral: 4.4 cm
Single Plane A2C EF: 48.9 %
Single Plane A4C EF: 49 %
Weight: 2539.7 [oz_av]

## 2024-07-10 LAB — PHOSPHORUS: Phosphorus: 3.1 mg/dL (ref 2.5–4.6)

## 2024-07-10 LAB — TSH: TSH: 6.037 u[IU]/mL — ABNORMAL HIGH (ref 0.350–4.500)

## 2024-07-10 LAB — URINALYSIS, ROUTINE W REFLEX MICROSCOPIC
Bilirubin Urine: NEGATIVE
Glucose, UA: NEGATIVE mg/dL
Hgb urine dipstick: NEGATIVE
Ketones, ur: NEGATIVE mg/dL
Leukocytes,Ua: NEGATIVE
Nitrite: NEGATIVE
Protein, ur: NEGATIVE mg/dL
Specific Gravity, Urine: 1.012 (ref 1.005–1.030)
pH: 6 (ref 5.0–8.0)

## 2024-07-10 LAB — RESP PANEL BY RT-PCR (RSV, FLU A&B, COVID)  RVPGX2
Influenza A by PCR: NEGATIVE
Influenza B by PCR: NEGATIVE
Resp Syncytial Virus by PCR: NEGATIVE
SARS Coronavirus 2 by RT PCR: NEGATIVE

## 2024-07-10 LAB — PROCALCITONIN: Procalcitonin: <0.1 ng/mL

## 2024-07-10 LAB — PROTIME-INR
INR: 1.1 (ref 0.8–1.2)
Prothrombin Time: 14.9 s (ref 11.4–15.2)

## 2024-07-10 LAB — HEPARIN LEVEL (UNFRACTIONATED): Heparin Unfractionated: <0.1 [IU]/mL — ABNORMAL LOW (ref 0.30–0.70)

## 2024-07-10 LAB — APTT
aPTT: 28 s (ref 24–36)
aPTT: 53 s — ABNORMAL HIGH (ref 24–36)

## 2024-07-10 LAB — MAGNESIUM: Magnesium: 2.2 mg/dL (ref 1.7–2.4)

## 2024-07-10 MED ORDER — TAMSULOSIN HCL 0.4 MG PO CAPS
0.4000 mg | ORAL_CAPSULE | Freq: Every day | ORAL | Status: DC
Start: 2024-07-10 — End: 2024-07-10

## 2024-07-10 MED ORDER — HEPARIN (PORCINE) 25000 UT/250ML-% IV SOLN
1000.0000 [IU]/h | INTRAVENOUS | Status: DC
  Administered 2024-07-10: 850 [IU]/h via INTRAVENOUS
  Administered 2024-07-11: 1000 [IU]/h via INTRAVENOUS
  Filled 2024-07-10 (×2): qty 250

## 2024-07-10 MED ORDER — METOPROLOL TARTRATE 5 MG/5ML IV SOLN: 5.0000 mg | Freq: Once | INTRAVENOUS | Status: DC | PRN

## 2024-07-10 MED ORDER — LINACLOTIDE 72 MCG PO CAPS
72.0000 ug | ORAL_CAPSULE | Freq: Every day | ORAL | Status: DC
  Administered 2024-07-11 – 2024-07-13 (×3): 72 ug via ORAL
  Filled 2024-07-10 (×3): qty 1

## 2024-07-10 MED ORDER — SODIUM CHLORIDE 0.9 % IV SOLN
2.0000 g | Freq: Once | INTRAVENOUS | Status: AC
  Administered 2024-07-10: 2 g via INTRAVENOUS
  Filled 2024-07-10: qty 20

## 2024-07-10 MED ORDER — TAMSULOSIN HCL 0.4 MG PO CAPS
0.4000 mg | ORAL_CAPSULE | Freq: Every day | ORAL | Status: DC
Start: 2024-07-10 — End: 2024-07-13
  Administered 2024-07-10 – 2024-07-12 (×4): 0.4 mg via ORAL
  Filled 2024-07-10 (×4): qty 1

## 2024-07-10 MED ORDER — ONDANSETRON HCL 4 MG/2ML IJ SOLN: 4.0000 mg | Freq: Four times a day (QID) | INTRAMUSCULAR | Status: DC | PRN

## 2024-07-10 MED ORDER — HEPARIN BOLUS VIA INFUSION
2000.0000 [IU] | Freq: Once | INTRAVENOUS | Status: AC
  Administered 2024-07-10: 2000 [IU] via INTRAVENOUS
  Filled 2024-07-10: qty 2000

## 2024-07-10 MED ORDER — METOPROLOL SUCCINATE ER 50 MG PO TB24
50.0000 mg | ORAL_TABLET | Freq: Every day | ORAL | Status: DC
  Administered 2024-07-11 – 2024-07-13 (×3): 50 mg via ORAL
  Filled 2024-07-10 (×3): qty 1

## 2024-07-10 MED ORDER — HYDROXYCHLOROQUINE SULFATE 200 MG PO TABS
200.0000 mg | ORAL_TABLET | Freq: Every day | ORAL | Status: DC
  Administered 2024-07-10 – 2024-07-13 (×4): 200 mg via ORAL
  Filled 2024-07-10 (×4): qty 1

## 2024-07-10 MED ORDER — METOPROLOL TARTRATE 50 MG PO TABS: 50.0000 mg | ORAL_TABLET | Freq: Once | ORAL | Status: DC | PRN

## 2024-07-10 MED ORDER — LINACLOTIDE 72 MCG PO CAPS
72.0000 ug | ORAL_CAPSULE | Freq: Every day | ORAL | Status: DC
  Administered 2024-07-10: 72 ug via ORAL
  Filled 2024-07-10: qty 1

## 2024-07-10 MED ORDER — GABAPENTIN 100 MG PO CAPS
100.0000 mg | ORAL_CAPSULE | Freq: Two times a day (BID) | ORAL | Status: DC
  Administered 2024-07-10 – 2024-07-13 (×7): 100 mg via ORAL
  Filled 2024-07-10 (×7): qty 1

## 2024-07-10 MED ORDER — METOPROLOL SUCCINATE ER 50 MG PO TB24: 25.0000 mg | ORAL_TABLET | Freq: Every day | ORAL | Status: DC

## 2024-07-10 MED ORDER — SODIUM CHLORIDE 0.9 % IV SOLN
500.0000 mg | Freq: Once | INTRAVENOUS | Status: DC
  Filled 2024-07-10: qty 5

## 2024-07-10 MED ORDER — ACETAMINOPHEN 325 MG PO TABS: 650.0000 mg | ORAL_TABLET | ORAL | Status: DC | PRN

## 2024-07-10 MED ORDER — METOPROLOL TARTRATE 25 MG PO TABS
25.0000 mg | ORAL_TABLET | Freq: Two times a day (BID) | ORAL | Status: DC
  Administered 2024-07-10: 25 mg via ORAL
  Filled 2024-07-10: qty 1

## 2024-07-10 MED ORDER — LINACLOTIDE 72 MCG PO CAPS: 72.0000 ug | ORAL_CAPSULE | Freq: Every day | ORAL | Status: DC

## 2024-07-10 MED ORDER — OXYCODONE HCL 5 MG PO TABS: 5.0000 mg | ORAL_TABLET | Freq: Two times a day (BID) | ORAL | Status: DC | PRN

## 2024-07-10 MED ORDER — TAMSULOSIN HCL 0.4 MG PO CAPS: 0.4000 mg | ORAL_CAPSULE | Freq: Every day | ORAL | Status: DC

## 2024-07-10 MED ORDER — MONTELUKAST SODIUM 10 MG PO TABS
5.0000 mg | ORAL_TABLET | Freq: Every day | ORAL | Status: DC
  Administered 2024-07-10 – 2024-07-12 (×3): 5 mg via ORAL
  Filled 2024-07-10 (×3): qty 1

## 2024-07-10 MED ORDER — ENOXAPARIN SODIUM 40 MG/0.4ML IJ SOSY: 40.0000 mg | PREFILLED_SYRINGE | INTRAMUSCULAR | Status: DC

## 2024-07-10 MED ORDER — HEPARIN BOLUS VIA INFUSION
1050.0000 [IU] | Freq: Once | INTRAVENOUS | Status: AC
  Administered 2024-07-10: 1050 [IU] via INTRAVENOUS
  Filled 2024-07-10: qty 1050

## 2024-07-10 NOTE — Discharge Instructions (Addendum)
 Follow up with PCP and pick up medications from pharmacy. Make sure to take all antibiotics. Do not double up on narcotic/pain medication or drink alcohol during this time. Do not drive while taking narcotic medication. Call 911 or return to ER for life threatening issues or other concerns.

## 2024-07-10 NOTE — ED Triage Notes (Signed)
 Pt arrived via [facility] from home with chest pain that started approx 2 hours prior to arrival. Pt off Eliquis  x3 days. Aspirin given in route, 324mg .

## 2024-07-10 NOTE — Consult Note (Signed)
 PHARMACY - ANTICOAGULATION CONSULT NOTE  Pharmacy Consult for Heparin  infusion  Indication: chest pain/ACS  Allergies  Allergen Reactions   Elemental Sulfur Hives   Septra [Sulfamethoxazole-Trimethoprim] Hives   Statins Other (See Comments)    Memory issues    Patient Measurements: Height: 5' 10 (177.8 cm) Weight: 72.8 kg (160 lb 6.4 oz) IBW/kg (Calculated) : 73 HEPARIN  DW (KG): 72.8  Vital Signs: Temp: 98.3 F (36.8 C) (08/20 2215) Temp Source: Oral (08/20 2215) BP: 133/82 (08/20 2215) Pulse Rate: 89 (08/20 2215)  Labs: Recent Labs    07/10/24 0520 07/10/24 0630 07/10/24 1034 07/10/24 1346 07/10/24 2143  HGB 12.8*  --   --   --   --   HCT 38.4*  --   --   --   --   PLT 154  --   --   --   --   APTT  --   --   --  28 53*  LABPROT  --   --   --  14.9  --   INR  --   --   --  1.1  --   HEPARINUNFRC  --   --   --  <0.10*  --   CREATININE 0.77  --   --   --   --   TROPONINIHS 9 19* 272* 838*  --     Estimated Creatinine Clearance: 61.9 mL/min (by C-G formula based on SCr of 0.77 mg/dL).   Medical History: Past Medical History:  Diagnosis Date   Atrial fibrillation (HCC)    Cancer (HCC) 2006   Squamous cell carcicoma   Cardiomyopathy (HCC)    Congestive heart failure (CHF) (HCC)    GERD (gastroesophageal reflux disease)    Hyperlipidemia    Hypertension    Neuropathy     Medications:  Medications Prior to Admission  Medication Sig Dispense Refill Last Dose/Taking   apixaban  (ELIQUIS ) 5 MG TABS tablet Take 1 tablet by mouth 2 (two) times daily. Pt stated that last time he took eliquis  was Sunday because of a injection that was supposed to take place today at 2pm.   07/07/2024   gabapentin  (NEURONTIN ) 100 MG capsule Take 100 mg by mouth 2 (two) times daily.   07/09/2024 Evening   hydroxychloroquine  (PLAQUENIL ) 200 MG tablet Take 200 mg by mouth daily.   07/09/2024 Evening   ketoconazole (NIZORAL) 2 % cream Apply 1 Application topically 2 (two) times daily.    07/09/2024   linaclotide  (LINZESS ) 72 MCG capsule Take 72 mcg by mouth daily before breakfast.   07/09/2024 Morning   metoprolol  succinate (TOPROL -XL) 25 MG 24 hr tablet Take 25 mg by mouth daily.   07/09/2024 Morning   montelukast  (SINGULAIR ) 10 MG tablet    07/09/2024   oxyCODONE  (OXY IR/ROXICODONE ) 5 MG immediate release tablet Take 5 mg by mouth 2 (two) times daily as needed.   Taking As Needed   tamsulosin  (FLOMAX ) 0.4 MG CAPS capsule Take 1 capsule (0.4 mg total) by mouth daily. (Patient taking differently: Take 0.4 mg by mouth daily after supper.) 90 capsule 3 07/09/2024 Evening   ammonium lactate (AMLACTIN) 12 % cream Apply topically. (Patient not taking: Reported on 05/13/2024)   Not Taking   erythromycin  ophthalmic ointment Place a 1/2 inch ribbon of ointment into the lower eyelid 3 times day for 7 days. (Patient not taking: Reported on 05/13/2024) 3.5 g 0 Not Taking   Flaxseed Oil (LINSEED OIL) OIL Use 1 capsule once daily (Patient not  taking: Reported on 07/10/2024)   Not Taking   fluticasone  (FLONASE ) 50 MCG/ACT nasal spray Place 2 sprays into both nostrils daily. (Patient not taking: Reported on 07/10/2024) 16 g 0 Not Taking   fluticasone -salmeterol (ADVAIR) 100-50 MCG/ACT AEPB Inhale into the lungs. (Patient not taking: Reported on 05/13/2024)   Not Taking   lisinopril (ZESTRIL) 2.5 MG tablet Take by mouth. (Patient not taking: Reported on 07/10/2024)   Not Taking   polyethylene glycol (MIRALAX / GLYCOLAX) 17 g packet Take 17 g by mouth daily as needed for mild constipation. (Patient not taking: Reported on 05/13/2024)   Not Taking   senna-docusate (SENOKOT-S) 8.6-50 MG tablet Take 1 tablet by mouth daily as needed for mild constipation. (Patient not taking: Reported on 05/13/2024)   Not Taking   Scheduled:   gabapentin   100 mg Oral BID   hydroxychloroquine   200 mg Oral Daily   [START ON 07/11/2024] linaclotide   72 mcg Oral QAC breakfast   [START ON 07/11/2024] metoprolol  succinate  50 mg Oral  Daily   montelukast   5 mg Oral QHS   tamsulosin   0.4 mg Oral QPC supper   Infusions:   heparin  850 Units/hr (07/10/24 1640)    Assessment:  89 y.o. male with medical history significant of PAF on Eliquis , chronic HFrEF with LVEF 50%, moderate MR, moderate TR, HTN, HLD, presented with new onset of chest pain. Will use aPTT to monitor heparin  due to falsely elevated heparin  level due to DOAC. Trop 272. CBC stable.   Goal of Therapy:  Heparin  level 0.3-0.7 units/ml once aPTT and heparin  level correlate.  aPTT 66-102 seconds Monitor platelets by anticoagulation protocol: Yes  8/20 @ 2143 aPTT 53 seconds SUBtherapeutic   Plan: Bolus 1050 units x 1 and increase rate of heparin  infusion to 1000 units/hr Check aPTT level 8 hours after rate change and daily heparin  level and CBC while on heparin  Continue to monitor H&H and platelets  Will M. Lenon, PharmD Clinical Pharmacist 07/10/2024 10:36 PM

## 2024-07-10 NOTE — H&P (Signed)
 History and Physical    Kenneth Watson:[id number] DOB: 11/28/1930 DOA: 07/10/2024  PCP: Eliverto Bette Hover, MD (Confirm with patient/family/NH records and if not entered, this has to be entered at [facility] point of entry) Patient coming from: Home  I have personally briefly reviewed patient's old medical records in [facility]  Chief Complaint: Chest pain  HPI: Kenneth Watson is a 89 y.o. male with medical history significant of PAF on Eliquis , chronic HFrEF with LVEF 50%, moderate MR, moderate TR, HTN, HLD, presented with new onset of chest pain.  Patient woke up this morning up around 230 with tenderness in the center chest, associated with left upper arm numbness, denied any palpitations no shortness of breath, no nauseous vomiting.  Wife called EMS, EMS arrived and gave patient 4 baby aspirin, chest pain was constant, but denied any exacerbation or relieving factors, and chest pain subsided in 45 minutes-1-hour.  3 days ago patient was instructed to stop taking Eliquis  for incoming epidural injection for back pain today. ED Course: Afebrile, not tachycardia lungs hypotension not hypoxic.  Telemonitoring showed frequent PVCs and occasional nonsustained V. tach, blood work showed a K4.3 magnesium 2.0 WBC 6.3 hemoglobin 12.8 bicarb 29, COVID-negative.  Chest x-ray showed possible signs of right failure area consolidation and infiltrates indicating for pneumonia.  Opening 8> 19.  EKG nonischemic.  Patient was given ceftriaxone  and azithromycin  in the ED.  Review of Systems: As per HPI otherwise 14 point review of systems negative.    Past Medical History:  Diagnosis Date   Atrial fibrillation (HCC)    Cancer (HCC) 2006   Squamous cell carcicoma   Cardiomyopathy (HCC)    Congestive heart failure (CHF) (HCC)    GERD (gastroesophageal reflux disease)    Hyperlipidemia    Hypertension    Neuropathy     Past Surgical History:  Procedure Laterality Date   FRACTURE SURGERY  89 yrs  old   elbow   HERNIA REPAIR     VASECTOMY       reports that he has never smoked. He has never been exposed to tobacco smoke. He has never used smokeless tobacco. He reports that he does not drink alcohol and does not use drugs.  Allergies  Allergen Reactions   Elemental Sulfur Hives   Septra [Sulfamethoxazole-Trimethoprim] Hives   Statins Other (See Comments)    Memory issues    Family History  Problem Relation Age of Onset   Heart attack Mother    Uterine cancer Mother    Kidney failure Father      Prior to Admission medications   Medication Sig Start Date End Date Taking? Authorizing Provider  apixaban  (ELIQUIS ) 5 MG TABS tablet Take 1 tablet by mouth 2 (two) times daily. Pt stated that last time he took eliquis  was Sunday because of a injection that was supposed to take place today at 2pm. 03/22/23  Yes [provider]  gabapentin  (NEURONTIN ) 100 MG capsule Take 100 mg by mouth 2 (two) times daily.   Yes [provider]  hydroxychloroquine  (PLAQUENIL ) 200 MG tablet Take 200 mg by mouth daily.   Yes [provider]  ketoconazole (NIZORAL) 2 % cream Apply 1 Application topically 2 (two) times daily. 09/13/23  Yes [provider]  linaclotide  (LINZESS ) 72 MCG capsule Take 72 mcg by mouth daily before breakfast. 06/18/24 12/15/24 Yes [provider]  metoprolol  succinate (TOPROL -XL) 25 MG 24 hr tablet Take 25 mg by mouth daily. 03/28/18  Yes [provider]  montelukast  (SINGULAIR ) 10 MG tablet  08/10/18  Yes [provider]  oxyCODONE  (OXY IR/ROXICODONE ) 5 MG immediate release tablet Take 5 mg by mouth 2 (two) times daily as needed. 06/03/24  Yes [provider]  tamsulosin  (FLOMAX ) 0.4 MG CAPS capsule Take 1 capsule (0.4 mg total) by mouth daily. Patient taking differently: Take 0.4 mg by mouth daily after supper. 05/13/24  Yes McGowan, Clotilda A, PA-C  ammonium lactate (AMLACTIN) 12 % cream Apply topically. Patient  not taking: Reported on 05/13/2024 02/24/22   [provider]  erythromycin  ophthalmic ointment Place a 1/2 inch ribbon of ointment into the lower eyelid 3 times day for 7 days. Patient not taking: Reported on 05/13/2024 10/08/23   Brimage, Vondra, DO  Flaxseed Oil (LINSEED OIL) OIL Use 1 capsule once daily Patient not taking: Reported on 07/10/2024    [provider]  fluticasone  (FLONASE ) 50 MCG/ACT nasal spray Place 2 sprays into both nostrils daily. Patient not taking: Reported on 07/10/2024 02/13/24   Mortenson, Ashley, MD  fluticasone -salmeterol (ADVAIR) 100-50 MCG/ACT AEPB Inhale into the lungs. Patient not taking: Reported on 05/13/2024 03/02/23   [provider]  lisinopril (ZESTRIL) 2.5 MG tablet Take by mouth. Patient not taking: Reported on 07/10/2024 10/08/19   [provider]  polyethylene glycol (MIRALAX / GLYCOLAX) 17 g packet Take 17 g by mouth daily as needed for mild constipation. Patient not taking: Reported on 05/13/2024    [provider]  senna-docusate (SENOKOT-S) 8.6-50 MG tablet Take 1 tablet by mouth daily as needed for mild constipation. Patient not taking: Reported on 05/13/2024    [provider]    Physical Exam: Vitals:   07/10/24 0508 07/10/24 0700 07/10/24 0730 07/10/24 0800  BP: 137/66 98/82 106/75 (!) 142/91  Pulse: 76 63 (!) 45 87  Resp: 12 17 13 15   Temp: 97.7 F (36.5 C)     SpO2: 100% 100% 98% 100%  Weight:      Height:        Constitutional: NAD, calm, comfortable Vitals:   07/10/24 0508 07/10/24 0700 07/10/24 0730 07/10/24 0800  BP: 137/66 98/82 106/75 (!) 142/91  Pulse: 76 63 (!) 45 87  Resp: 12 17 13 15   Temp: 97.7 F (36.5 C)     SpO2: 100% 100% 98% 100%  Weight:      Height:       Eyes: PERRL, lids and conjunctivae normal ENMT: Mucous membranes are moist. Posterior pharynx clear of any exudate or lesions.Normal dentition.  Neck: normal, supple, no masses, no thyromegaly Respiratory:  clear to auscultation bilaterally, no wheezing, no crackles. Normal respiratory effort. No accessory muscle use.  Cardiovascular: Regular rate and rhythm, systolic murmur. No extremity edema. 2+ pedal pulses. No carotid bruits.  Abdomen: no tenderness, no masses palpated. No hepatosplenomegaly. Bowel sounds positive.  Musculoskeletal: no clubbing / cyanosis. No joint deformity upper and lower extremities. Good ROM, no contractures. Normal muscle tone.  Skin: no rashes, lesions, ulcers. No induration Neurologic: CN 2-12 grossly intact. Sensation intact, DTR normal. Strength 5/5 in all 4.  Psychiatric: Normal judgment and insight. Alert and oriented x 3. Normal mood.     Labs on Admission: I have personally reviewed following labs and imaging studies  CBC: Recent Labs  Lab 07/10/24 0520  WBC 6.3  HGB 12.8*  HCT 38.4*  MCV 101.1*  PLT 154   Basic Metabolic Panel: Recent Labs  Lab 07/10/24 0520  NA 139  K 4.3  CL 102  CO2 29  GLUCOSE 105*  BUN 11  CREATININE 0.77  CALCIUM 9.2  MG 2.2   GFR: Estimated Creatinine Clearance: 61.3 mL/min (by C-G formula based on SCr of 0.77 mg/dL). Liver Function Tests: No results for input(s): AST, ALT, ALKPHOS, BILITOT, PROT, ALBUMIN in the last 168 hours. No results for input(s): LIPASE, AMYLASE in the last 168 hours. No results for input(s): AMMONIA in the last 168 hours. Coagulation Profile: No results for input(s): INR, PROTIME in the last 168 hours. Cardiac Enzymes: No results for input(s): CKTOTAL, CKMB, CKMBINDEX, TROPONINI in the last 168 hours. BNP (last 3 results) No results for input(s): PROBNP in the last 8760 hours. HbA1C: No results for input(s): HGBA1C in the last 72 hours. CBG: No results for input(s): GLUCAP in the last 168 hours. Lipid Profile: No results for input(s): CHOL, HDL, LDLCALC, TRIG, CHOLHDL, LDLDIRECT in the last 72 hours. Thyroid Function Tests: No results  for input(s): TSH, T4TOTAL, FREET4, T3FREE, THYROIDAB in the last 72 hours. Anemia Panel: No results for input(s): VITAMINB12, FOLATE, FERRITIN, TIBC, IRON, RETICCTPCT in the last 72 hours. Urine analysis:    Component Value Date/Time   COLORURINE YELLOW (A) 07/10/2024 0621   APPEARANCEUR CLEAR (A) 07/10/2024 0621   LABSPEC 1.012 07/10/2024 0621   PHURINE 6.0 07/10/2024 0621   GLUCOSEU NEGATIVE 07/10/2024 0621   HGBUR NEGATIVE 07/10/2024 0621   BILIRUBINUR NEGATIVE 07/10/2024 0621   KETONESUR NEGATIVE 07/10/2024 0621   PROTEINUR NEGATIVE 07/10/2024 0621   NITRITE NEGATIVE 07/10/2024 0621   LEUKOCYTESUR NEGATIVE 07/10/2024 9378    Radiological Exams on Admission: DG Chest Port 1 View Result Date: 07/10/2024 CLINICAL DATA:  Chest pain EXAM: PORTABLE CHEST 1 VIEW COMPARISON:  09/15/2022 FINDINGS: The cardio pericardial silhouette is enlarged. Interstitial markings are diffusely coarsened with chronic features. Subtle patchy opacities seen in the parahilar and lower right lung. No acute bony abnormality. Telemetry leads overlie the chest. IMPRESSION: Subtle patchy opacities in the parahilar and lower right lung. Pneumonia not excluded. Follow-up recommended to ensure resolution. Electronically Signed   By: Camellia Candle M.D.   On: 07/10/2024 05:25    EKG: Independently reviewed.  Sinus rhythm, frequent PVCs, no acute ST changes.  Assessment/Plan Principal Problem:   Chest pain  (please populate well all problems here in Problem List. (For example, if patient is on BP meds at home and you resume or decide to hold them, it is a problem that needs to be her. Same for CAD, COPD, HLD and so on)  Anginal-like chest pain - Rule out ACS.  Borderline elevation of troponin level, clinically suspect it is related to frequent PVCs, no significant A-fib with RVR or ventricle tachycardia noticed on ED monitoring.  Continue telemonitoring x 24 hours.  Discussed case with on-call  cardiology who recommended increase metoprolol  dosage. - K= 4.3, magnesium 2.0, will check phosphorus and TSH level. - Echocardiogram - Other DDx, given that patient has been compliant with Eliquis  which was just stopped 3 days ago, clinically have low suspicion for PE.  Plan to resume Eliquis  after epidural injection.  RLL PNA ruled out - Patient has no cough, no fever or chills - CT chest without contrast in ED does not show significant infiltrates or consolidation, procalcitonin level is low, and no leukocytosis, will hold off antibiotics.  Frequent PVCs Nonsustained V. Tach - As per recommendation from cardiology will increase metoprolol  to 25 mg twice daily.  PAF - In sinus rhythm - Eliquis  on hold for incoming epidural injection.  Hx of MR Hx of TR - Stable, euvolemic, no indication for diuresis.  RA - Stable, continue hydroxychloroquine   Total time spent on patient care 55 minutes.  DVT prophylaxis: Lovenox  Code Status: Full code Family Communication: Wife at bedside Disposition Plan: [facility] less than 2 midnight hospital stay Consults called: Curbside consult with cardiology [facility] Admission status: Tele obs   Cort ONEIDA Mana MD [facility] Pager [phone]  07/10/2024, 10:30 AM

## 2024-07-10 NOTE — ED Notes (Signed)
 Fall bundle initiated, pt not on bed alarm d/t walking independency and being able to stand at bedside to use the urinal.

## 2024-07-10 NOTE — ED Notes (Signed)
 CRITICAL VALUE STICKER  CRITICAL VALUE: Troponin- 272  MD NOTIFIED: Dr Laurita  TIME OF NOTIFICATION:1220  RESPONSE: Verbal acknowledge

## 2024-07-10 NOTE — Consult Note (Addendum)
 PHARMACY - ANTICOAGULATION CONSULT NOTE  Pharmacy Consult for Heparin  infusion  Indication: chest pain/ACS  Allergies  Allergen Reactions   Elemental Sulfur Hives   Septra [Sulfamethoxazole-Trimethoprim] Hives   Statins Other (See Comments)    Memory issues    Patient Measurements: Height: 5' 10 (177.8 cm) Weight: 72 kg (158 lb 11.7 oz) IBW/kg (Calculated) : 73 HEPARIN  DW (KG): 72  Vital Signs: Temp: 97.9 F (36.6 C) (08/20 1045) Temp Source: Oral (08/20 1045) BP: 116/69 (08/20 1227) Pulse Rate: 92 (08/20 1227)  Labs: Recent Labs    07/10/24 0520 07/10/24 0630 07/10/24 1034  HGB 12.8*  --   --   HCT 38.4*  --   --   PLT 154  --   --   CREATININE 0.77  --   --   TROPONINIHS 9 19* 272*    Estimated Creatinine Clearance: 61.3 mL/min (by C-G formula based on SCr of 0.77 mg/dL).   Medical History: Past Medical History:  Diagnosis Date   Atrial fibrillation (HCC)    Cancer (HCC) 2006   Squamous cell carcicoma   Cardiomyopathy (HCC)    Congestive heart failure (CHF) (HCC)    GERD (gastroesophageal reflux disease)    Hyperlipidemia    Hypertension    Neuropathy     Medications:  (Not in a hospital admission)  Scheduled:   enoxaparin  (LOVENOX ) injection  40 mg Subcutaneous Q24H   gabapentin   100 mg Oral BID   hydroxychloroquine   200 mg Oral Daily   [START ON 07/11/2024] linaclotide   72 mcg Oral QAC breakfast   metoprolol  tartrate  25 mg Oral BID   montelukast   5 mg Oral QHS   tamsulosin   0.4 mg Oral QPC supper   Infusions:   Assessment:  89 y.o. male with medical history significant of PAF on Eliquis , chronic HFrEF with LVEF 50%, moderate MR, moderate TR, HTN, HLD, presented with new onset of chest pain. Will use aPTT to monitor heparin  due to falsely elevated heparin  level due to DOAC. Trop 272. CBC stable.   Goal of Therapy:  Heparin  level 0.3-0.7 units/ml once aPTT and heparin  level correlate.  aPTT 66-102 seconds Monitor platelets by  anticoagulation protocol: Yes   Plan:  Give 2000 units bolus x 1 (will give half bolus due to unknown time of last apixaban  dose) Start heparin  infusion at 850 units/hr Check aPTT level in 8 hours and daily heparin  level and CBC while on heparin  Continue to monitor H&H and platelets  Cathaleen GORMAN Blanch, PharmD, BCPS 07/10/2024,12:42 PM

## 2024-07-10 NOTE — ED Notes (Signed)
MD made aware of trop

## 2024-07-10 NOTE — ED Notes (Signed)
 CCMD called at this time to admit patient to monitor.

## 2024-07-10 NOTE — ED Provider Notes (Signed)
 [facility] Provider Note    Event Date/Time   First MD Initiated Contact with Patient 07/10/24 0502     (approximate)   History   Chest Pain   HPI  Kenneth Watson is a 89 y.o. male with history of cardiomyopathy, hypertension, hyperlipidemia, atrial fibrillation on Eliquis  who presents to the emergency department with left-sided chest discomfort that woke him from sleep around 2:30 AM.  Reports he had a tightness in his left arm.  No shortness of breath, nausea, vomiting, diaphoresis or dizziness.  Wife reports she felt his forehead and he felt clammy.  No fevers or cough.  No lower extremity swelling or pain.  No prior history of PE or DVT.  No recent surgery, hospitalization, prolonged travel.  He reports taking full dose aspirin at home.  Now symptoms have almost completely resolved.  Has a local cardiologist, Dr. Wilburn.  Reports he is scheduled to have an epidural injection today for lumbosacral radiculopathy that he has been dealing with for several weeks.  He has been off of his Eliquis  for several days in preparation for this.  He did take an oxycodone  yesterday at 7 PM for pain but states he has taken this medication previously without any abnormal side effects, symptoms.   History provided by patient and wife.    Past Medical History:  Diagnosis Date   Atrial fibrillation (HCC)    Cancer (HCC) 2006   Squamous cell carcicoma   Cardiomyopathy (HCC)    Congestive heart failure (CHF) (HCC)    GERD (gastroesophageal reflux disease)    Hyperlipidemia    Hypertension    Neuropathy     Past Surgical History:  Procedure Laterality Date   FRACTURE SURGERY  89 yrs old   elbow   HERNIA REPAIR     VASECTOMY      MEDICATIONS:  Prior to Admission medications   Medication Sig Start Date End Date Taking? Authorizing Provider  ammonium lactate (AMLACTIN) 12 % cream Apply topically. Patient not taking: Reported on 05/13/2024 02/24/22   [provider]  apixaban  (ELIQUIS ) 5 MG TABS tablet Take 1 tablet by mouth 2 (two) times daily. 03/22/23   [provider]  erythromycin  ophthalmic ointment Place a 1/2 inch ribbon of ointment into the lower eyelid 3 times day for 7 days. Patient not taking: Reported on 05/13/2024 10/08/23   Brimage, Vondra, DO  Flaxseed Oil (LINSEED OIL) OIL Use 1 capsule once daily    [provider]  fluticasone  (FLONASE ) 50 MCG/ACT nasal spray Place 2 sprays into both nostrils daily. 02/13/24   Mortenson, Ashley, MD  fluticasone -salmeterol (ADVAIR) 100-50 MCG/ACT AEPB Inhale into the lungs. Patient not taking: Reported on 05/13/2024 03/02/23   [provider]  lisinopril (ZESTRIL) 2.5 MG tablet Take by mouth. 10/08/19   [provider]  metoprolol  succinate (TOPROL -XL) 25 MG 24 hr tablet Take 25 mg by mouth daily. 03/28/18   [provider]  montelukast  (SINGULAIR ) 10 MG tablet  08/10/18   [provider]  polyethylene glycol (MIRALAX / GLYCOLAX) 17 g packet Take 17 g by mouth daily as needed for mild constipation. Patient not taking: Reported on 05/13/2024    [provider]  senna-docusate (SENOKOT-S) 8.6-50 MG tablet Take 1 tablet by mouth daily as needed for mild constipation. Patient not taking: Reported on 05/13/2024    [provider]  tamsulosin  (FLOMAX ) 0.4 MG CAPS capsule Take 1 capsule (0.4 mg total) by mouth daily. 05/13/24  Helon Clotilda LABOR, PA-C    Physical Exam   Triage Vital Signs: ED Triage Vitals  Encounter Vitals Group     BP 07/10/24 0508 137/66     Girls Systolic BP Percentile --      Girls Diastolic BP Percentile --      Boys Systolic BP Percentile --      Boys Diastolic BP Percentile --      Pulse Rate 07/10/24 0508 76     Resp 07/10/24 0508 12     Temp 07/10/24 0508 97.7 F (36.5 C)     Temp src --      SpO2 07/10/24 0508 100 %     Weight 07/10/24 0451 158 lb 11.7 oz (72 kg)     Height 07/10/24 0451 5' 10  (1.778 m)     Head Circumference --      Peak Flow --      Pain Score --      Pain Loc --      Pain Education --      Exclude from Growth Chart --     Most recent vital signs: Vitals:   07/10/24 0700 07/10/24 0730  BP: 98/82 106/75  Pulse: 63 (!) 45  Resp: 17 13  Temp:    SpO2: 100% 98%    CONSTITUTIONAL: Alert, responds appropriately to questions. Well-appearing; well-nourished, elderly, pleasant HEAD: Normocephalic, atraumatic EYES: Conjunctivae clear, pupils appear equal, sclera nonicteric ENT: normal nose; moist mucous membranes NECK: Supple, normal ROM CARD: Irregular and rate controlled; S1 and S2 appreciated RESP: Normal chest excursion without splinting or tachypnea; breath sounds clear and equal bilaterally; no wheezes, no rhonchi, no rales, no hypoxia or respiratory distress, speaking full sentences ABD/GI: Non-distended; soft, non-tender, no rebound, no guarding, no peritoneal signs BACK: The back appears normal EXT: Normal ROM in all joints; no deformity noted, no edema, no calf tenderness or calf swelling SKIN: Normal color for age and race; warm; no rash on exposed skin NEURO: Moves all extremities equally, normal speech PSYCH: The patient's mood and manner are appropriate.   ED Results / Procedures / Treatments   LABS: (all labs ordered are listed, but only abnormal results are displayed) Labs Reviewed  BASIC METABOLIC PANEL WITH GFR - Abnormal; Notable for the following components:      Result Value   Glucose, Bld 105 (*)    All other components within normal limits  CBC - Abnormal; Notable for the following components:   RBC 3.80 (*)    Hemoglobin 12.8 (*)    HCT 38.4 (*)    MCV 101.1 (*)    All other components within normal limits  URINALYSIS, ROUTINE W REFLEX MICROSCOPIC - Abnormal; Notable for the following components:   Color, Urine YELLOW (*)    APPearance CLEAR (*)    All other components within normal limits  TROPONIN I (HIGH SENSITIVITY)  - Abnormal; Notable for the following components:   Troponin I (High Sensitivity) 19 (*)    All other components within normal limits  RESP PANEL BY RT-PCR (RSV, FLU A&B, COVID)  RVPGX2  CULTURE, BLOOD (ROUTINE X 2)  CULTURE, BLOOD (ROUTINE X 2)  MAGNESIUM  PROCALCITONIN  TROPONIN I (HIGH SENSITIVITY)  TROPONIN I (HIGH SENSITIVITY)     EKG:  EKG Interpretation Date/Time:  Wednesday July 10 2024 05:06:28 EDT Ventricular Rate:  92 PR Interval:    QRS Duration:  109 QT Interval:  436 QTC Calculation: 447 R Axis:   69  Text  Interpretation: Atrial fibrillation Ventricular bigeminy Minimal ST depression, anterolateral leads Confirmed by Neomi Neptune [phone]) on 07/10/2024 5:16:53 AM         RADIOLOGY: My personal review and interpretation of imaging: Chest x-ray shows possible pneumonia.  I have personally reviewed all radiology reports.   DG Chest Port 1 View Result Date: 07/10/2024 CLINICAL DATA:  Chest pain EXAM: PORTABLE CHEST 1 VIEW COMPARISON:  09/15/2022 FINDINGS: The cardio pericardial silhouette is enlarged. Interstitial markings are diffusely coarsened with chronic features. Subtle patchy opacities seen in the parahilar and lower right lung. No acute bony abnormality. Telemetry leads overlie the chest. IMPRESSION: Subtle patchy opacities in the parahilar and lower right lung. Pneumonia not excluded. Follow-up recommended to ensure resolution. Electronically Signed   By: Camellia Candle M.D.   On: 07/10/2024 05:25     PROCEDURES:  Critical Care performed: No     .1-3 Lead EKG Interpretation  Performed by: Sonya Gunnoe, Neptune SAILOR, DO Authorized by: Brigett Estell, Neptune SAILOR, DO     Interpretation: abnormal     ECG rate:  76   ECG rate assessment: normal     Rhythm: atrial fibrillation     Ectopy: PVCs     Conduction: normal       IMPRESSION / MDM / ASSESSMENT AND PLAN / ED COURSE  I reviewed the triage vital signs and the nursing notes.    Patient here with chest  discomfort, left arm discomfort that has resolved.  The patient is on the cardiac monitor to evaluate for evidence of arrhythmia and/or significant heart rate changes.   DIFFERENTIAL DIAGNOSIS (includes but not limited to):   ACS, anemia, electrolyte derangement, UTI, less likely pneumonia, PE, dissection   Patient's presentation is most consistent with acute presentation with potential threat to life or bodily function.   PLAN: EKG shows no ischemia but does show ectopy.  Will check electrolytes.  Will obtain troponin x 2, chest x-ray.  Will also obtain urinalysis.  Patient denying any discomfort currently.  Low suspicion for PE.  He is not tachycardic, tachypneic or hypoxic here.   MEDICATIONS GIVEN IN ED: Medications  cefTRIAXone  (ROCEPHIN ) 2 g in sodium chloride  0.9 % 100 mL IVPB (has no administration in time range)  azithromycin  (ZITHROMAX ) 500 mg in sodium chloride  0.9 % 250 mL IVPB (has no administration in time range)     ED COURSE: Patient's chest x-ray reviewed and interpreted by myself and the radiologist and shows opacities in the right lung which could represent pneumonia.  He is not having any fever or productive cough.  COVID, flu and RSV swab pending.  Will obtain blood cultures, start antibiotics for community-acquired pneumonia.  Procalcitonin pending.  Does not appear septic today.  Patient's first troponin was 9.  Has risen to 59.  He is not having any chest discomfort currently but given possible pneumonia and rising troponin in a 89 year old patient with multiple risk factors, I have recommended admission.  Patient and wife are comfortable with this plan.  Will discuss with the hospitalist.   CONSULTS:  Consulted and discussed patient's case with hospitalist, Dr. Laurita.  I have recommended admission and consulting physician agrees and will place admission orders.  Patient (and family if present) agree with this plan.   I reviewed all nursing notes, vitals, pertinent  previous records.  All labs, EKGs, imaging ordered have been independently reviewed and interpreted by myself.    OUTSIDE RECORDS REVIEWED: Reviewed recent orthopedic notes.       FINAL  CLINICAL IMPRESSION(S) / ED DIAGNOSES   Final diagnoses:  Nonspecific chest pain  Community acquired pneumonia of right lung, unspecified part of lung     Rx / DC Orders   ED Discharge Orders     None        Note:  This document was prepared using Dragon voice recognition software and may include unintentional dictation errors.   Zanae Kuehnle, Josette SAILOR, [place] 07/10/24 [phone]

## 2024-07-10 NOTE — ED Notes (Addendum)
 Gave pt and pt's wife coffee per their request. Educated pt and family on the admitting process, that we request that pt does not take their medications from home, and that a pharmacy tech will be by to do a medication reconciliation once pt is admitted in our system. Pt and family verbalized understanding the information provided.

## 2024-07-10 NOTE — ED Notes (Signed)
 Date and time results received: 07/10/24 1435 (use smartphrase .now to insert current time)  Test: troponin Critical Value: 838  Name of Provider Notified: MD Laurita  Orders Received? Or Actions Taken?:

## 2024-07-10 NOTE — Progress Notes (Signed)
*  PRELIMINARY RESULTS* Echocardiogram 2D Echocardiogram has been performed.  Floydene Harder 07/10/2024, 2:28 PM

## 2024-07-10 NOTE — Progress Notes (Signed)
*  PRELIMINARY RESULTS* Echocardiogram 2D Echocardiogram has been performed.  Kenneth Watson 07/10/2024, 2:28 PM

## 2024-07-10 NOTE — Consult Note (Signed)
 [facility] CLINIC CARDIOLOGY CONSULT NOTE       Patient ID: Kenneth Watson MRN: [id number] DOB/AGE: December 02, 1930 89 y.o.  Admit date: 07/10/2024 Referring Physician Dr. Cort Mana Primary Physician Olmedo, Bette Hover, MD  Primary Cardiologist Dr. Wilburn Reason for Consultation chest pain, elevated troponin  HPI: Kenneth Watson is a 89 y.o. male  with a past medical history of chronic atrial fibrillation, chronic HFrEF, moderate MR/TR, frequent PVCs/NSVT, hypertension, hyperlipidemia who presented to the ED on 07/10/2024 for chest pain.  Troponins uptrending.  Cardiology was consulted for further evaluation.   Patient states that he woke up around 230 this morning with chest pain.  Also endorses associated L arm dull pain and heaviness.  Symptoms persisted and thus he decided to come to the ED for further evaluation.  Workup in the ED notable for creatinine 0.77, potassium 4.3, hemoglobin 12.8, WBC 6.3. Troponins 9 > 19 > 272. EKG in the ED atrial fibrillation, frequent PVCs rate 92 bpm. Started on IV heparin  in the ED.   At the time my evaluation cephradine patient is resting comfortably in hospital bed with wife present at bedside.  We discussed his symptoms in further detail.  He states that the chest discomfort persisted until roughly 11 AM today.  He denies any similar episodes recently.  We discussed his elevation of troponin.  Denies any associated shortness of breath or palpitations.  States that he has been doing well otherwise recently with no cardiac issues, lives at home with his wife and is relatively independent.  Review of systems complete and found to be negative unless listed above    Past Medical History:  Diagnosis Date   Atrial fibrillation (HCC)    Cancer (HCC) 2006   Squamous cell carcicoma   Cardiomyopathy (HCC)    Congestive heart failure (CHF) (HCC)    GERD (gastroesophageal reflux disease)    Hyperlipidemia    Hypertension    Neuropathy     Past Surgical  History:  Procedure Laterality Date   FRACTURE SURGERY  89 yrs old   elbow   HERNIA REPAIR     VASECTOMY      (Not in a hospital admission)  Social History   Socioeconomic History   Marital status: Married    Spouse name: Not on file   Number of children: Not on file   Years of education: Not on file   Highest education level: Not on file  Occupational History   Not on file  Tobacco Use   Smoking status: Never    Passive exposure: Never   Smokeless tobacco: Never  Vaping Use   Vaping status: Never Used  Substance and Sexual Activity   Alcohol use: No   Drug use: No   Sexual activity: Not Currently  Other Topics Concern   Not on file  Social History Narrative   Not on file   Social Drivers of Health   Financial Resource Strain: Low Risk  (06/11/2024)   Received from [facility]   Overall Financial Resource Strain (CARDIA)    Difficulty of Paying Living Expenses: Not hard at all  Food Insecurity: No Food Insecurity (06/11/2024)   Received from [facility]   Hunger Vital Sign    Within the past 12 months, you worried that your food would run out before you got the money to buy more.: Never true    Within the past 12 months, the food you bought just didn't last and you didn't have money  to get more.: Never true  Transportation Needs: No Transportation Needs (06/11/2024)   Received from [facility] - Transportation    In the past 12 months, has lack of transportation kept you from medical appointments or from getting medications?: No    Lack of Transportation (Non-Medical): No  Physical Activity: Sufficiently Active (05/18/2022)   Received from [facility]   Exercise Vital Sign    On average, how many days per week do you engage in moderate to strenuous exercise (like a brisk walk)?: 4 days    On average, how many minutes do you engage in exercise at this level?: 40 min  Stress: Not on file   Social Connections: Socially Integrated (05/18/2022)   Received from [facility]   Social Connection and Isolation Panel    In a typical week, how many times do you talk on the phone with family, friends, or neighbors?: More than three times a week    How often do you get together with friends or relatives?: Once a week    How often do you attend church or religious services?: More than 4 times per year    Do you belong to any clubs or organizations such as church groups, unions, fraternal or athletic groups, or school groups?: Yes    How often do you attend meetings of the clubs or organizations you belong to?: More than 4 times per year    Are you married, widowed, divorced, separated, never married, or living with a partner?: Married  Intimate Partner Violence: Not on file    Family History  Problem Relation Age of Onset   Heart attack Mother    Uterine cancer Mother    Kidney failure Father      Vitals:   07/10/24 0730 07/10/24 0800 07/10/24 1045 07/10/24 1227  BP: 106/75 (!) 142/91 123/74 116/69  Pulse: (!) 45 87 86 92  Resp: 13 15 (!) 25   Temp:   97.9 F (36.6 C)   TempSrc:   Oral   SpO2: 98% 100% 100%   Weight:      Height:        PHYSICAL EXAM General: Well appearing elderly male, well nourished, in no acute distress. HEENT: Normocephalic and atraumatic. Neck: No JVD.  Lungs: Normal respiratory effort on room air. Clear bilaterally to auscultation. No wheezes, crackles, rhonchi.  Heart: HRRR. Normal S1 and S2 without gallops or murmurs.  Abdomen: Non-distended appearing.  Msk: Normal strength and tone for age. Extremities: Warm and well perfused. No clubbing, cyanosis. No edema.  Neuro: Alert and oriented X 3. Psych: Answers questions appropriately.   Labs: Basic Metabolic Panel: Recent Labs    07/10/24 0520  NA 139  K 4.3  CL 102  CO2 29  GLUCOSE 105*  BUN 11  CREATININE 0.77  CALCIUM 9.2  MG 2.2  PHOS 3.1   Liver Function  Tests: No results for input(s): AST, ALT, ALKPHOS, BILITOT, PROT, ALBUMIN in the last 72 hours. No results for input(s): LIPASE, AMYLASE in the last 72 hours. CBC: Recent Labs    07/10/24 0520  WBC 6.3  HGB 12.8*  HCT 38.4*  MCV 101.1*  PLT 154   Cardiac Enzymes: Recent Labs    07/10/24 0520 07/10/24 0630 07/10/24 1034  TROPONINIHS 9 19* 272*   BNP: No results for input(s): BNP in the last 72 hours. D-Dimer: No results for input(s): DDIMER in the last 72 hours. Hemoglobin A1C:  No results for input(s): HGBA1C in the last 72 hours. Fasting Lipid Panel: No results for input(s): CHOL, HDL, LDLCALC, TRIG, CHOLHDL, LDLDIRECT in the last 72 hours. Thyroid Function Tests: Recent Labs    07/10/24 0520  TSH 6.037*   Anemia Panel: No results for input(s): VITAMINB12, FOLATE, FERRITIN, TIBC, IRON, RETICCTPCT in the last 72 hours.   Radiology: CT CHEST WO CONTRAST Result Date: 07/10/2024 CLINICAL DATA:  Pneumonia EXAM: CT CHEST WITHOUT CONTRAST TECHNIQUE: Multidetector CT imaging of the chest was performed following the standard protocol without IV contrast. RADIATION DOSE REDUCTION: This exam was performed according to the departmental dose-optimization program which includes automated exposure control, adjustment of the mA and/or kV according to patient size and/or use of iterative reconstruction technique. COMPARISON:  04/15/2018 and chest radiograph 07/10/2024 FINDINGS: Cardiovascular: Coronary, aortic arch, and branch vessel atherosclerotic vascular disease. Moderate cardiomegaly with right heart predominance. Mediastinum/Nodes: Unremarkable Lungs/Pleura: Biapical pleuroparenchymal scarring. Scarring or atelectasis peripherally in the right lower lobe on image 137 series 3. Volume loss and mild scarring or atelectasis in the left lower lobe. Dense calcified left lower lobe granuloma appears unchanged. Stable left lower lobe subpleural lymph  node on image 74 series 3. Upper Abdomen: Punctate calcifications throughout the spleen compatible with old granulomatous disease. Musculoskeletal: Stable right posterolateral seventh and eighth rib deformity. IMPRESSION: 1. No findings of pneumonia. 2. Moderate cardiomegaly with right heart predominance. 3. Stable right posterolateral seventh and eighth rib deformity. 4. Old granulomatous disease. 5.  Aortic Atherosclerosis (ICD10-I70.0).  Coronary atherosclerosis. Electronically Signed   By: Ryan Salvage M.D.   On: 07/10/2024 10:56   DG Chest Port 1 View Result Date: 07/10/2024 CLINICAL DATA:  Chest pain EXAM: PORTABLE CHEST 1 VIEW COMPARISON:  09/15/2022 FINDINGS: The cardio pericardial silhouette is enlarged. Interstitial markings are diffusely coarsened with chronic features. Subtle patchy opacities seen in the parahilar and lower right lung. No acute bony abnormality. Telemetry leads overlie the chest. IMPRESSION: Subtle patchy opacities in the parahilar and lower right lung. Pneumonia not excluded. Follow-up recommended to ensure resolution. Electronically Signed   By: Camellia Candle M.D.   On: 07/10/2024 05:25    ECHO pending  TELEMETRY reviewed by me 07/10/2024: Fibrillation, PVCs rate 70-80s  EKG reviewed by me: Atrial fibrillation, PVCs rate 92 bpm  Data reviewed by me 07/10/2024: last 24h vitals tele labs imaging I/O ED provider note, admission H&P  Principal Problem:   Chest pain    ASSESSMENT AND PLAN:  Kenneth Watson is a 89 y.o. male  with a past medical history of chronic atrial fibrillation, chronic HFrEF, moderate MR/TR, frequent PVCs/NSVT, hypertension, hyperlipidemia who presented to the ED on 07/10/2024 for chest pain.  Troponins uptrending.  Cardiology was consulted for further evaluation.   # Chest pain # Elevated troponin # Chronic HFrEF # Frequent PVCs Patient presented with CP that woke him up this AM, now resolved.  EKG without acute ischemic changes.  Troponins  trended 9 > 19 > 272.  Started on IV heparin  in the ED.  Initial chest x-ray with concern for pneumonia, ruled out on CT chest. Appears dry on exam.  -Agree with IV heparin .  -Echo pending.  -Increase metoprolol  succinate to 50 mg daily.  -Plan for coronary CTA tomorrow for additional evaluation.    TIMI Risk Score for Unstable Angina or Non-ST Elevation MI:   The patient's TIMI risk score is 3, which indicates a 13% risk of all cause mortality, new or recurrent myocardial infarction or need for urgent  revascularization in the next 14 days.   This patient's plan of care was discussed and created with Dr. Florencio and he is in agreement.  Signed: Danita Bloch, PA-C  07/10/2024, 2:17 PM [facility]

## 2024-07-11 DIAGNOSIS — K219 Gastro-esophageal reflux disease without esophagitis: Secondary | ICD-10-CM | POA: Diagnosis present

## 2024-07-11 DIAGNOSIS — R079 Chest pain, unspecified: Secondary | ICD-10-CM | POA: Diagnosis present

## 2024-07-11 DIAGNOSIS — E782 Mixed hyperlipidemia: Secondary | ICD-10-CM | POA: Diagnosis present

## 2024-07-11 DIAGNOSIS — M069 Rheumatoid arthritis, unspecified: Secondary | ICD-10-CM | POA: Diagnosis present

## 2024-07-11 DIAGNOSIS — I493 Ventricular premature depolarization: Secondary | ICD-10-CM | POA: Diagnosis present

## 2024-07-11 DIAGNOSIS — Z7901 Long term (current) use of anticoagulants: Secondary | ICD-10-CM | POA: Diagnosis not present

## 2024-07-11 DIAGNOSIS — I482 Chronic atrial fibrillation, unspecified: Secondary | ICD-10-CM | POA: Diagnosis not present

## 2024-07-11 DIAGNOSIS — Z8249 Family history of ischemic heart disease and other diseases of the circulatory system: Secondary | ICD-10-CM | POA: Diagnosis not present

## 2024-07-11 DIAGNOSIS — Z79899 Other long term (current) drug therapy: Secondary | ICD-10-CM | POA: Diagnosis not present

## 2024-07-11 DIAGNOSIS — I340 Nonrheumatic mitral (valve) insufficiency: Secondary | ICD-10-CM | POA: Diagnosis present

## 2024-07-11 DIAGNOSIS — I214 Non-ST elevation (NSTEMI) myocardial infarction: Secondary | ICD-10-CM | POA: Insufficient documentation

## 2024-07-11 DIAGNOSIS — Z1152 Encounter for screening for COVID-19: Secondary | ICD-10-CM | POA: Diagnosis not present

## 2024-07-11 DIAGNOSIS — I110 Hypertensive heart disease with heart failure: Secondary | ICD-10-CM | POA: Diagnosis present

## 2024-07-11 DIAGNOSIS — I4819 Other persistent atrial fibrillation: Secondary | ICD-10-CM | POA: Diagnosis present

## 2024-07-11 DIAGNOSIS — I5022 Chronic systolic (congestive) heart failure: Secondary | ICD-10-CM | POA: Diagnosis present

## 2024-07-11 DIAGNOSIS — I071 Rheumatic tricuspid insufficiency: Secondary | ICD-10-CM | POA: Diagnosis present

## 2024-07-11 DIAGNOSIS — E871 Hypo-osmolality and hyponatremia: Secondary | ICD-10-CM | POA: Diagnosis present

## 2024-07-11 DIAGNOSIS — Z7951 Long term (current) use of inhaled steroids: Secondary | ICD-10-CM | POA: Diagnosis not present

## 2024-07-11 LAB — BLOOD CULTURE ID PANEL (REFLEXED) - BCID2

## 2024-07-11 LAB — BASIC METABOLIC PANEL WITH GFR
Anion gap: 8 (ref 5–15)
BUN: 9 mg/dL (ref 8–23)
CO2: 28 mmol/L (ref 22–32)
Calcium: 8.9 mg/dL (ref 8.9–10.3)
Chloride: 101 mmol/L (ref 98–111)
Creatinine, Ser: 0.82 mg/dL (ref 0.61–1.24)
GFR, Estimated: >60 mL/min (ref >60)
Glucose, Bld: 107 mg/dL — ABNORMAL HIGH (ref 70–99)
Potassium: 3.5 mmol/L (ref 3.5–5.1)
Sodium: 137 mmol/L (ref 135–145)

## 2024-07-11 LAB — HEPARIN LEVEL (UNFRACTIONATED)
Heparin Unfractionated: 0.35 [IU]/mL (ref 0.30–0.70)
Heparin Unfractionated: 0.35 [IU]/mL (ref 0.30–0.70)

## 2024-07-11 LAB — CBC
HCT: 39.4 % (ref 39.0–52.0)
Hemoglobin: 13.2 g/dL (ref 13.0–17.0)
MCH: 33.6 pg (ref 26.0–34.0)
MCHC: 33.5 g/dL (ref 30.0–36.0)
MCV: 100.3 fL — ABNORMAL HIGH (ref 80.0–100.0)
Platelets: 150 K/uL (ref 150–400)
RBC: 3.93 MIL/uL — ABNORMAL LOW (ref 4.22–5.81)
RDW: 12.8 % (ref 11.5–15.5)
WBC: 6.4 K/uL (ref 4.0–10.5)
nRBC: 0 % (ref 0.0–0.2)

## 2024-07-11 NOTE — Care Management Obs Status (Signed)
 MEDICARE OBSERVATION STATUS NOTIFICATION   Patient Details  Name: Kenneth Watson MRN: [id number] Date of Birth: Sep 19, 1931   Medicare Observation Status Notification Given:  Yes    [name] 07/11/2024, 12:16 PM

## 2024-07-11 NOTE — Consult Note (Signed)
 PHARMACY - ANTICOAGULATION CONSULT NOTE  Pharmacy Consult for Heparin  infusion  Indication: chest pain/ACS  Allergies  Allergen Reactions   Elemental Sulfur Hives   Septra [Sulfamethoxazole-Trimethoprim] Hives   Statins Other (See Comments)    Memory issues    Patient Measurements: Height: 5' 10 (177.8 cm) Weight: 72.8 kg (160 lb 6.4 oz) IBW/kg (Calculated) : 73 HEPARIN  DW (KG): 72.8  Vital Signs: Temp: 97.8 F (36.6 C) (08/21 1016) Temp Source: Oral (08/21 1016) BP: 124/83 (08/21 1016) Pulse Rate: 85 (08/21 1016)  Labs: Recent Labs    07/10/24 0520 07/10/24 0630 07/10/24 1034 07/10/24 1346 07/10/24 2143 07/11/24 0602 07/11/24 0728 07/11/24 1522  HGB 12.8*  --   --   --   --  13.2  --   --   HCT 38.4*  --   --   --   --  39.4  --   --   PLT 154  --   --   --   --  150  --   --   APTT  --   --   --  28 53*  --   --   --   LABPROT  --   --   --  14.9  --   --   --   --   INR  --   --   --  1.1  --   --   --   --   HEPARINUNFRC  --   --   --  <0.10*  --   --  0.35 0.35  CREATININE 0.77  --   --   --   --  0.82  --   --   TROPONINIHS 9 19* 272* 838*  --   --   --   --     Estimated Creatinine Clearance: 60.4 mL/min (by C-G formula based on SCr of 0.82 mg/dL).   Medical History: Past Medical History:  Diagnosis Date   Atrial fibrillation (HCC)    Cancer (HCC) 2006   Squamous cell carcicoma   Cardiomyopathy (HCC)    Congestive heart failure (CHF) (HCC)    GERD (gastroesophageal reflux disease)    Hyperlipidemia    Hypertension    Neuropathy     Medications:  Medications Prior to Admission  Medication Sig Dispense Refill Last Dose/Taking   apixaban  (ELIQUIS ) 5 MG TABS tablet Take 1 tablet by mouth 2 (two) times daily. Pt stated that last time he took eliquis  was Sunday because of a injection that was supposed to take place today at 2pm.   07/07/2024   gabapentin  (NEURONTIN ) 100 MG capsule Take 100 mg by mouth 2 (two) times daily.   07/09/2024 Evening    hydroxychloroquine  (PLAQUENIL ) 200 MG tablet Take 200 mg by mouth daily.   07/09/2024 Evening   ketoconazole (NIZORAL) 2 % cream Apply 1 Application topically 2 (two) times daily.   07/09/2024   linaclotide  (LINZESS ) 72 MCG capsule Take 72 mcg by mouth daily before breakfast.   07/09/2024 Morning   metoprolol  succinate (TOPROL -XL) 25 MG 24 hr tablet Take 25 mg by mouth daily.   07/09/2024 Morning   montelukast  (SINGULAIR ) 10 MG tablet    07/09/2024   oxyCODONE  (OXY IR/ROXICODONE ) 5 MG immediate release tablet Take 5 mg by mouth 2 (two) times daily as needed.   Taking As Needed   tamsulosin  (FLOMAX ) 0.4 MG CAPS capsule Take 1 capsule (0.4 mg total) by mouth daily. (Patient taking differently: Take 0.4 mg by mouth  daily after supper.) 90 capsule 3 07/09/2024 Evening   ammonium lactate (AMLACTIN) 12 % cream Apply topically. (Patient not taking: Reported on 05/13/2024)   Not Taking   erythromycin  ophthalmic ointment Place a 1/2 inch ribbon of ointment into the lower eyelid 3 times day for 7 days. (Patient not taking: Reported on 05/13/2024) 3.5 g 0 Not Taking   Flaxseed Oil (LINSEED OIL) OIL Use 1 capsule once daily (Patient not taking: Reported on 07/10/2024)   Not Taking   fluticasone  (FLONASE ) 50 MCG/ACT nasal spray Place 2 sprays into both nostrils daily. (Patient not taking: Reported on 07/10/2024) 16 g 0 Not Taking   fluticasone -salmeterol (ADVAIR) 100-50 MCG/ACT AEPB Inhale into the lungs. (Patient not taking: Reported on 05/13/2024)   Not Taking   lisinopril (ZESTRIL) 2.5 MG tablet Take by mouth. (Patient not taking: Reported on 07/10/2024)   Not Taking   polyethylene glycol (MIRALAX / GLYCOLAX) 17 g packet Take 17 g by mouth daily as needed for mild constipation. (Patient not taking: Reported on 05/13/2024)   Not Taking   senna-docusate (SENOKOT-S) 8.6-50 MG tablet Take 1 tablet by mouth daily as needed for mild constipation. (Patient not taking: Reported on 05/13/2024)   Not Taking   Scheduled:   gabapentin    100 mg Oral BID   hydroxychloroquine   200 mg Oral Daily   linaclotide   72 mcg Oral QAC breakfast   metoprolol  succinate  50 mg Oral Daily   montelukast   5 mg Oral QHS   tamsulosin   0.4 mg Oral QPC supper   Infusions:   heparin  1,000 Units/hr (07/11/24 1343)    Assessment:  89 y.o. male with medical history significant of PAF on Eliquis , chronic HFrEF with LVEF 50%, moderate MR, moderate TR, HTN, HLD, presented with new onset of chest pain. Trop 272. CBC stable.   Baseline heparin  level was not falsely elevated.   Goal of Therapy:  Heparin  level 0.3-0.7 Monitor platelets by anticoagulation protocol: Yes  8/20 @ 2143 aPTT 53 seconds SUBtherapeutic 8/21 @ 0728 HL 0.35 Therapeutic x 1 8/21 @ 1522 HL 0.35 Therapeutic x 2   Plan: Heparin  level is therapeutic x2 doses.  Will continue heparin  infusion at 1000 units/hr.  Recheck heparin  level daily with AM labs.  CBC daily while on heparin .   Estill CHRISTELLA Lutes, PharmD, BCPS Clinical Pharmacist 07/11/2024 4:04 PM

## 2024-07-11 NOTE — Progress Notes (Addendum)
  Progress Note   Patient: Kenneth Watson FMW:[id number] DOB: 1931/09/01 DOA: 07/10/2024     0 DOS: the patient was seen and examined on 07/11/2024   Brief hospital course: Kenneth Watson is a 89 y.o. male with medical history significant of PAF on Eliquis , chronic HFrEF with LVEF 50%, moderate MR, moderate TR, HTN, HLD, presented with new onset of chest pain.  Troponin elevated at 838.  Procalcitonin level less than 0.1. Patient placed on heparin  drip.  Cardiology consult obtained.    Principal Problem:   Chest pain Active Problems:   Chronic a-fib (HCC)   Chronic systolic CHF (congestive heart failure), NYHA class 2 (HCC)   Gastroesophageal reflux disease   Hyperlipidemia, mixed   Moderate mitral regurgitation   Moderate tricuspid insufficiency   NSTEMI (non-ST elevated myocardial infarction) (HCC)   Assessment and Plan: Non-STEMI. Patient had a second chest pain yesterday, troponin elevated at 838, condition consistent with non-STEMI as EKG did not show any ST elevation. Cardiology consult obtained, scheduled for CT angiogram coronary.  Will continue to follow-up with results. Patient also placed on heparin  drip, will continue for at least a 48 to 72 hours.  Paroxysmal atrial fibrillation. Eliquis  was on hold due to future epidural injection.  Will not restart at discharge. Currently on heparin  drip for NSTEMI.  Moderate mitral regurgitation. Moderate tricuspid regurgitation. Chronic systolic congestive heart failure No evidence of congestive heart failure. Repeat echocardiogram performed 8/21 showed ejection fraction 45 to 50%  Rheumatoid arthritis  continue home medicines.  Positive blood culture Blood culture positive for staph species, most likely contaminant.  Repeat cultures.  Hold off antibiotics     Subjective:  Feel better today, no additional chest pain or shortness of breath  Physical Exam: Vitals:   07/10/24 2215 07/10/24 2356 07/11/24 0429 07/11/24  1016  BP: 133/82 (!) 117/54 122/76 124/83  Pulse: 89 (!) 101 97 85  Resp: 20 17 20    Temp: 98.3 F (36.8 C) 98.7 F (37.1 C) 97.6 F (36.4 C) 97.8 F (36.6 C)  TempSrc: Oral Oral  Oral  SpO2: 99% 94% 95% 98%  Weight: 72.8 kg     Height: 5' 10 (1.778 m)      General exam: Appears calm and comfortable  Respiratory system: Clear to auscultation. Respiratory effort normal. Cardiovascular system: Irregular. No JVD, murmurs, rubs, gallops or clicks. No pedal edema. Gastrointestinal system: Abdomen is nondistended, soft and nontender. No organomegaly or masses felt. Normal bowel sounds heard. Central nervous system: Alert and oriented x3. No focal neurological deficits. Extremities: Symmetric 5 x 5 power. Skin: No rashes, lesions or ulcers Psychiatry: Judgement and insight appear normal. Mood & affect appropriate.    Data Reviewed:  EKG, telemetry, lab results reviewed, CT scan results reviewed  Family Communication: Wife and daughter updated at bedside  Disposition: Status is: Observation      Time spent: 35 minutes  Author: Murvin Mana, MD 07/11/2024 12:01 PM  For on call review [email or web address].

## 2024-07-11 NOTE — Plan of Care (Signed)
  Problem: Activity: Goal: Ability to tolerate increased activity will improve Outcome: Progressing   Problem: Cardiac: Goal: Ability to achieve and maintain adequate cardiovascular perfusion will improve Outcome: Progressing   Problem: Elimination: Goal: Will not experience complications related to urinary retention Outcome: Progressing   Problem: Pain Managment: Goal: General experience of comfort will improve and/or be controlled Outcome: Progressing   Problem: Safety: Goal: Ability to remain free from injury will improve Outcome: Progressing   Problem: Skin Integrity: Goal: Risk for impaired skin integrity will decrease Outcome: Progressing

## 2024-07-11 NOTE — Progress Notes (Signed)
 Patient ID: Kenneth Watson, male   DOB: [birth year], 89 y.o.   MRN: [id number] [facility]    SUBJECTIVE: Patient states improved chest pain and arm pain no significant shortness of breath no palpitation tachycardia no bleeding sitting up eating dinner feels reasonably well.  Wife in the room with questions about his care   Vitals:   07/10/24 2132 07/10/24 2215 07/10/24 2356 07/11/24 0429  BP: 112/74 133/82 (!) 117/54 122/76  Pulse: 76 89 (!) 101 97  Resp: (!) 22 20 17 20   Temp: 98.2 F (36.8 C) 98.3 F (36.8 C) 98.7 F (37.1 C) 97.6 F (36.4 C)  TempSrc:  Oral Oral   SpO2: 96% 99% 94% 95%  Weight:  72.8 kg    Height:  5' 10 (1.778 m)       Intake/Output Summary (Last 24 hours) at 07/11/2024 0803 Last data filed at 07/11/2024 9364 Gross per 24 hour  Intake 95.56 ml  Output 750 ml  Net -654.44 ml      PHYSICAL EXAM  General: Well developed, well nourished, in no acute distress HEENT:  Normocephalic and atramatic Neck:  No JVD.  Lungs: Clear bilaterally to auscultation and percussion. Heart: HRRR . Normal S1 and S2 without gallops or murmurs.  Abdomen: Bowel sounds are positive, abdomen soft and non-tender  Msk:  Back normal, normal gait. Normal strength and tone for age. Extremities: No clubbing, cyanosis or edema.   Neuro: Alert and oriented X 3. Psych:  Good affect, responds appropriately   LABS: Basic Metabolic Panel: Recent Labs    07/10/24 0520 07/11/24 0602  NA 139 137  K 4.3 3.5  CL 102 101  CO2 29 28  GLUCOSE 105* 107*  BUN 11 9  CREATININE 0.77 0.82  CALCIUM 9.2 8.9  MG 2.2  --   PHOS 3.1  --    Liver Function Tests: No results for input(s): AST, ALT, ALKPHOS, BILITOT, PROT, ALBUMIN in the last 72 hours. No results for input(s): LIPASE, AMYLASE in the last 72 hours. CBC: Recent Labs    07/10/24 0520 07/11/24 0602  WBC 6.3 6.4  HGB 12.8* 13.2  HCT 38.4* 39.4  MCV 101.1* 100.3*  PLT 154 150   Cardiac Enzymes: No  results for input(s): CKTOTAL, CKMB, CKMBINDEX, TROPONINI in the last 72 hours. BNP: Invalid input(s): POCBNP D-Dimer: No results for input(s): DDIMER in the last 72 hours. Hemoglobin A1C: No results for input(s): HGBA1C in the last 72 hours. Fasting Lipid Panel: No results for input(s): CHOL, HDL, LDLCALC, TRIG, CHOLHDL, LDLDIRECT in the last 72 hours. Thyroid Function Tests: Recent Labs    07/10/24 0520  TSH 6.037*   Anemia Panel: No results for input(s): VITAMINB12, FOLATE, FERRITIN, TIBC, IRON, RETICCTPCT in the last 72 hours.  ECHOCARDIOGRAM COMPLETE Result Date: 07/10/2024    ECHOCARDIOGRAM REPORT   Patient Name:   Kenneth Watson Date of Exam: 07/10/2024 Medical Rec #:  [id number]      Height:       70.0 in Accession #:    [id number]     Weight:       158.7 lb Date of Birth:  1931-02-07      BSA:          1.892 m Patient Age:    89 years       BP:           123/74 mmHg Patient Gender: M              HR:  86 bpm. Exam Location:  [facility] Procedure: 2D Echo, Cardiac Doppler and Color Doppler (Both Spectral and Color            Flow Doppler were utilized during procedure). Indications:     Chest pain R07.9  History:         Patient has no prior history of Echocardiogram examinations.                  Cardiomyopathy, Arrythmias:Atrial Fibrillation; Risk                  Factors:Hypertension and Dyslipidemia.  Sonographer:     Christopher Furnace Referring Phys:  [id number] CORT ONEIDA MANA Diagnosing Phys: Cara JONETTA Lovelace MD IMPRESSIONS  1. Left ventricular ejection fraction, by estimation, is 45 to 50%. The left ventricle has mildly decreased function. The left ventricle demonstrates global hypokinesis. The left ventricular internal cavity size was severely dilated. Left ventricular diastolic function could not be evaluated.  2. Right ventricular systolic function is low normal. The right ventricular size is mildly enlarged.  3. Left atrial size was moderately  dilated.  4. The mitral valve is myxomatous. Mild mitral valve regurgitation.  5. Tricuspid valve regurgitation is mild to moderate.  6. The aortic valve is grossly normal. Aortic valve regurgitation is trivial. Aortic valve sclerosis/calcification is present, without any evidence of aortic stenosis. FINDINGS  Left Ventricle: Left ventricular ejection fraction, by estimation, is 45 to 50%. The left ventricle has mildly decreased function. The left ventricle demonstrates global hypokinesis. Strain was performed and the global longitudinal strain is indeterminate. The left ventricular internal cavity size was severely dilated. There is borderline left ventricular hypertrophy. Left ventricular diastolic function could not be evaluated. Right Ventricle: The right ventricular size is mildly enlarged. No increase in right ventricular wall thickness. Right ventricular systolic function is low normal. Left Atrium: Left atrial size was moderately dilated. Right Atrium: Right atrial size was normal in size. Pericardium: There is no evidence of pericardial effusion. Mitral Valve: The mitral valve is myxomatous. Mild mitral valve regurgitation. Tricuspid Valve: The tricuspid valve is grossly normal. Tricuspid valve regurgitation is mild to moderate. Aortic Valve: The aortic valve is grossly normal. Aortic valve regurgitation is trivial. Aortic valve sclerosis/calcification is present, without any evidence of aortic stenosis. Aortic valve mean gradient measures 1.0 mmHg. Aortic valve peak gradient measures 2.4 mmHg. Aortic valve area, by VTI measures 3.86 cm. Pulmonic Valve: The pulmonic valve was normal in structure. Pulmonic valve regurgitation is not visualized. Aorta: The ascending aorta was not well visualized. IAS/Shunts: No atrial level shunt detected by color flow Doppler. Additional Comments: 3D was performed not requiring image post processing on an independent workstation and was indeterminate.  LEFT VENTRICLE PLAX  2D LVIDd:         5.90 cm      Diastology LVIDs:         4.40 cm      LV e' medial:   9.46 cm/s LV PW:         1.10 cm      LV E/e' medial: 7.3 LV IVS:        0.80 cm LVOT diam:     2.10 cm LV SV:         47 LV SV Index:   25 LVOT Area:     3.46 cm  LV Volumes (MOD) LV vol d, MOD A2C: 137.0 ml LV vol d, MOD A4C: 128.0 ml LV vol s, MOD A2C: 70.0  ml LV vol s, MOD A4C: 65.3 ml LV SV MOD A2C:     67.0 ml LV SV MOD A4C:     128.0 ml LV SV MOD BP:      62.2 ml RIGHT VENTRICLE RV Basal diam:  4.80 cm RV Mid diam:    3.80 cm LEFT ATRIUM              Index        RIGHT ATRIUM           Index LA diam:        5.20 cm  2.75 cm/m   RA Area:     28.60 cm LA Vol (A2C):   112.0 ml 59.19 ml/m  RA Volume:   90.40 ml  47.78 ml/m LA Vol (A4C):   97.3 ml  51.43 ml/m LA Biplane Vol: 109.0 ml 57.61 ml/m  AORTIC VALVE AV Area (Vmax):    2.91 cm AV Area (Vmean):   2.73 cm AV Area (VTI):     3.86 cm AV Vmax:           78.00 cm/s AV Vmean:          52.700 cm/s AV VTI:            0.121 m AV Peak Grad:      2.4 mmHg AV Mean Grad:      1.0 mmHg LVOT Vmax:         65.50 cm/s LVOT Vmean:        41.600 cm/s LVOT VTI:          0.135 m LVOT/AV VTI ratio: 1.12  AORTA Ao Root diam: 3.60 cm MITRAL VALVE               TRICUSPID VALVE MV Area (PHT): 1.85 cm    TR Peak grad:   40.4 mmHg MV Decel Time: 409 msec    TR Vmax:        318.00 cm/s MV E velocity: 69.20 cm/s                            SHUNTS                            Systemic VTI:  0.14 m                            Systemic Diam: 2.10 cm Cara JONETTA Lovelace MD Electronically signed by Cara JONETTA Lovelace MD Signature Date/Time: 07/10/2024/5:05:59 PM    Final    CT CHEST WO CONTRAST Result Date: 07/10/2024 CLINICAL DATA:  Pneumonia EXAM: CT CHEST WITHOUT CONTRAST TECHNIQUE: Multidetector CT imaging of the chest was performed following the standard protocol without IV contrast. RADIATION DOSE REDUCTION: This exam was performed according to the departmental dose-optimization program which  includes automated exposure control, adjustment of the mA and/or kV according to patient size and/or use of iterative reconstruction technique. COMPARISON:  04/15/2018 and chest radiograph 07/10/2024 FINDINGS: Cardiovascular: Coronary, aortic arch, and branch vessel atherosclerotic vascular disease. Moderate cardiomegaly with right heart predominance. Mediastinum/Nodes: Unremarkable Lungs/Pleura: Biapical pleuroparenchymal scarring. Scarring or atelectasis peripherally in the right lower lobe on image 137 series 3. Volume loss and mild scarring or atelectasis in the left lower lobe. Dense calcified left lower lobe granuloma appears unchanged. Stable left lower lobe subpleural lymph node on image 74 series 3.  Upper Abdomen: Punctate calcifications throughout the spleen compatible with old granulomatous disease. Musculoskeletal: Stable right posterolateral seventh and eighth rib deformity. IMPRESSION: 1. No findings of pneumonia. 2. Moderate cardiomegaly with right heart predominance. 3. Stable right posterolateral seventh and eighth rib deformity. 4. Old granulomatous disease. 5.  Aortic Atherosclerosis (ICD10-I70.0).  Coronary atherosclerosis. Electronically Signed   By: Ryan Salvage M.D.   On: 07/10/2024 10:56   DG Chest Port 1 View Result Date: 07/10/2024 CLINICAL DATA:  Chest pain EXAM: PORTABLE CHEST 1 VIEW COMPARISON:  09/15/2022 FINDINGS: The cardio pericardial silhouette is enlarged. Interstitial markings are diffusely coarsened with chronic features. Subtle patchy opacities seen in the parahilar and lower right lung. No acute bony abnormality. Telemetry leads overlie the chest. IMPRESSION: Subtle patchy opacities in the parahilar and lower right lung. Pneumonia not excluded. Follow-up recommended to ensure resolution. Electronically Signed   By: Camellia Candle M.D.   On: 07/10/2024 05:25     Echo EF of around 45 to 50%  TELEMETRY: Atrial fibrillation rate of 70:  ASSESSMENT AND  PLAN:  Principal Problem:   Chest pain Possible angina Hyperlipidemia Elevated troponins Possible non-STEMI Valvular regurgitation Atrial fibrillation persistent  Plan Agree with telemetry follow-up EKGs troponins and telemetry monitoring Unable to perform CT because of atrial fibrillation and irregular heartbeat And nitrate and beta-blockers to help with symptom management and control chest pain Statin therapy to help with lipid management Consider cardiac cath or functional study prior to discharge    Cara JONETTA Lovelace, MD 07/11/2024 8:03 AM

## 2024-07-11 NOTE — Progress Notes (Signed)
 Heart Failure Navigator Progress Note  Assessed for Heart & Vascular TOC clinic readiness.  Patient does not meet criteria due to current [facility] patient.   Navigator will sign off at this time.  Charmaine Pines, RN, BSN [facility] Heart Failure Navigator Secure Chat Only

## 2024-07-11 NOTE — Consult Note (Addendum)
 PHARMACY - ANTICOAGULATION CONSULT NOTE  Pharmacy Consult for Heparin  infusion  Indication: chest pain/ACS  Allergies  Allergen Reactions   Elemental Sulfur Hives   Septra [Sulfamethoxazole-Trimethoprim] Hives   Statins Other (See Comments)    Memory issues    Patient Measurements: Height: 5' 10 (177.8 cm) Weight: 72.8 kg (160 lb 6.4 oz) IBW/kg (Calculated) : 73 HEPARIN  DW (KG): 72.8  Vital Signs: Temp: 97.6 F (36.4 C) (08/21 0429) Temp Source: Oral (08/20 2356) BP: 122/76 (08/21 0429) Pulse Rate: 97 (08/21 0429)  Labs: Recent Labs    07/10/24 0520 07/10/24 0630 07/10/24 1034 07/10/24 1346 07/10/24 2143 07/11/24 0602 07/11/24 0728  HGB 12.8*  --   --   --   --  13.2  --   HCT 38.4*  --   --   --   --  39.4  --   PLT 154  --   --   --   --  150  --   APTT  --   --   --  28 53*  --   --   LABPROT  --   --   --  14.9  --   --   --   INR  --   --   --  1.1  --   --   --   HEPARINUNFRC  --   --   --  <0.10*  --   --  0.35  CREATININE 0.77  --   --   --   --  0.82  --   TROPONINIHS 9 19* 272* 838*  --   --   --     Estimated Creatinine Clearance: 60.4 mL/min (by C-G formula based on SCr of 0.82 mg/dL).   Medical History: Past Medical History:  Diagnosis Date   Atrial fibrillation (HCC)    Cancer (HCC) 2006   Squamous cell carcicoma   Cardiomyopathy (HCC)    Congestive heart failure (CHF) (HCC)    GERD (gastroesophageal reflux disease)    Hyperlipidemia    Hypertension    Neuropathy     Medications:  Medications Prior to Admission  Medication Sig Dispense Refill Last Dose/Taking   apixaban  (ELIQUIS ) 5 MG TABS tablet Take 1 tablet by mouth 2 (two) times daily. Pt stated that last time he took eliquis  was Sunday because of a injection that was supposed to take place today at 2pm.   07/07/2024   gabapentin  (NEURONTIN ) 100 MG capsule Take 100 mg by mouth 2 (two) times daily.   07/09/2024 Evening   hydroxychloroquine  (PLAQUENIL ) 200 MG tablet Take 200 mg by  mouth daily.   07/09/2024 Evening   ketoconazole (NIZORAL) 2 % cream Apply 1 Application topically 2 (two) times daily.   07/09/2024   linaclotide  (LINZESS ) 72 MCG capsule Take 72 mcg by mouth daily before breakfast.   07/09/2024 Morning   metoprolol  succinate (TOPROL -XL) 25 MG 24 hr tablet Take 25 mg by mouth daily.   07/09/2024 Morning   montelukast  (SINGULAIR ) 10 MG tablet    07/09/2024   oxyCODONE  (OXY IR/ROXICODONE ) 5 MG immediate release tablet Take 5 mg by mouth 2 (two) times daily as needed.   Taking As Needed   tamsulosin  (FLOMAX ) 0.4 MG CAPS capsule Take 1 capsule (0.4 mg total) by mouth daily. (Patient taking differently: Take 0.4 mg by mouth daily after supper.) 90 capsule 3 07/09/2024 Evening   ammonium lactate (AMLACTIN) 12 % cream Apply topically. (Patient not taking: Reported on 05/13/2024)   Not  Taking   erythromycin  ophthalmic ointment Place a 1/2 inch ribbon of ointment into the lower eyelid 3 times day for 7 days. (Patient not taking: Reported on 05/13/2024) 3.5 g 0 Not Taking   Flaxseed Oil (LINSEED OIL) OIL Use 1 capsule once daily (Patient not taking: Reported on 07/10/2024)   Not Taking   fluticasone  (FLONASE ) 50 MCG/ACT nasal spray Place 2 sprays into both nostrils daily. (Patient not taking: Reported on 07/10/2024) 16 g 0 Not Taking   fluticasone -salmeterol (ADVAIR) 100-50 MCG/ACT AEPB Inhale into the lungs. (Patient not taking: Reported on 05/13/2024)   Not Taking   lisinopril (ZESTRIL) 2.5 MG tablet Take by mouth. (Patient not taking: Reported on 07/10/2024)   Not Taking   polyethylene glycol (MIRALAX / GLYCOLAX) 17 g packet Take 17 g by mouth daily as needed for mild constipation. (Patient not taking: Reported on 05/13/2024)   Not Taking   senna-docusate (SENOKOT-S) 8.6-50 MG tablet Take 1 tablet by mouth daily as needed for mild constipation. (Patient not taking: Reported on 05/13/2024)   Not Taking   Scheduled:   gabapentin   100 mg Oral BID   hydroxychloroquine   200 mg Oral Daily    linaclotide   72 mcg Oral QAC breakfast   metoprolol  succinate  50 mg Oral Daily   montelukast   5 mg Oral QHS   tamsulosin   0.4 mg Oral QPC supper   Infusions:   heparin  1,000 Units/hr (07/10/24 2314)    Assessment:  89 y.o. male with medical history significant of PAF on Eliquis , chronic HFrEF with LVEF 50%, moderate MR, moderate TR, HTN, HLD, presented with new onset of chest pain. Trop 272. CBC stable.   Baseline heparin  level was not falsely elevated.   Goal of Therapy:  Heparin  level 0.3-0.7 Monitor platelets by anticoagulation protocol: Yes  8/20 @ 2143 aPTT 53 seconds SUBtherapeutic 8/21 @ 0728 HL 0.35    Plan: Heparin  level is therapeutic. Will continue heparin  infusion at 1000 units/hr. Recheck heparin  level in 8 hours. CBC daily while on heparin .   Cathaleen Blanch, PharmD Clinical Pharmacist 07/11/2024 10:02 AM

## 2024-07-11 NOTE — Hospital Course (Addendum)
 Kenneth Watson is a 89 y.o. male with medical history significant of PAF on Eliquis , chronic HFrEF with LVEF 50%, moderate MR, moderate TR, HTN, HLD, presented with new onset of chest pain.  Troponin elevated at 838.  Procalcitonin level less than 0.1. Patient placed on heparin  drip.  Cardiology consult obtained. Cardiac PET scan showed a small area of ischemic changes in the apical area, cardiac cath with deferred. Patient will be discharged home today with a stable condition and to follow-up with cardiology as outpatient.

## 2024-07-11 NOTE — Consult Note (Signed)
 PHARMACY - PHYSICIAN COMMUNICATION CRITICAL VALUE ALERT - BLOOD CULTURE IDENTIFICATION (BCID)  Kenneth Watson is an 89 y.o. male who presented to [facility] on 07/10/2024 with a chief complaint of chest pain   Assessment:  1 out of 4 bottles positive for CoNS   Name of physician (or Provider) Contacted: Zhang  Current antibiotics: None  Changes to prescribed antibiotics recommended: None  Pt remains afeb, WBC WNL and procal negative. Pt is not currently on any antibiotics. Per previous MD PNA is ruled out. I believe this is probably a contaminant. Do not recommend any abx at this time.  Recommendations accepted by provider  Results for orders placed or performed during the hospital encounter of 07/10/24  Blood Culture ID Panel (Reflexed) (Collected: 07/10/2024 10:35 AM)  Result Value Ref Range   Enterococcus faecalis NOT DETECTED NOT DETECTED   Enterococcus Faecium NOT DETECTED NOT DETECTED   Listeria monocytogenes NOT DETECTED NOT DETECTED   Staphylococcus species DETECTED (A) NOT DETECTED   Staphylococcus aureus (BCID) NOT DETECTED NOT DETECTED   Staphylococcus epidermidis NOT DETECTED NOT DETECTED   Staphylococcus lugdunensis NOT DETECTED NOT DETECTED   Streptococcus species NOT DETECTED NOT DETECTED   Streptococcus agalactiae NOT DETECTED NOT DETECTED   Streptococcus pneumoniae NOT DETECTED NOT DETECTED   Streptococcus pyogenes NOT DETECTED NOT DETECTED   A.calcoaceticus-baumannii NOT DETECTED NOT DETECTED   Bacteroides fragilis NOT DETECTED NOT DETECTED   Enterobacterales NOT DETECTED NOT DETECTED   Enterobacter cloacae complex NOT DETECTED NOT DETECTED   Escherichia coli NOT DETECTED NOT DETECTED   Klebsiella aerogenes NOT DETECTED NOT DETECTED   Klebsiella oxytoca NOT DETECTED NOT DETECTED   Klebsiella pneumoniae NOT DETECTED NOT DETECTED   Proteus species NOT DETECTED NOT DETECTED   Salmonella species NOT DETECTED NOT DETECTED   Serratia marcescens NOT DETECTED NOT  DETECTED   Haemophilus influenzae NOT DETECTED NOT DETECTED   Neisseria meningitidis NOT DETECTED NOT DETECTED   Pseudomonas aeruginosa NOT DETECTED NOT DETECTED   Stenotrophomonas maltophilia NOT DETECTED NOT DETECTED   Candida albicans NOT DETECTED NOT DETECTED   Candida auris NOT DETECTED NOT DETECTED   Candida glabrata NOT DETECTED NOT DETECTED   Candida krusei NOT DETECTED NOT DETECTED   Candida parapsilosis NOT DETECTED NOT DETECTED   Candida tropicalis NOT DETECTED NOT DETECTED   Cryptococcus neoformans/gattii NOT DETECTED NOT DETECTED    Kenneth Watson 07/11/2024  7:42 AM

## 2024-07-11 NOTE — Plan of Care (Signed)
°  Problem: Activity: Goal: Ability to tolerate increased activity will improve Outcome: Progressing   Problem: Health Behavior/Discharge Planning: Goal: Ability to safely manage health-related needs after discharge will improve Outcome: Progressing   Problem: Education: Goal: Knowledge of General Education information will improve Description: Including pain rating scale, medication(s)/side effects and non-pharmacologic comfort measures Outcome: Progressing

## 2024-07-12 ENCOUNTER — Inpatient Hospital Stay

## 2024-07-12 DIAGNOSIS — I214 Non-ST elevation (NSTEMI) myocardial infarction: Secondary | ICD-10-CM | POA: Diagnosis not present

## 2024-07-12 DIAGNOSIS — I482 Chronic atrial fibrillation, unspecified: Secondary | ICD-10-CM | POA: Diagnosis not present

## 2024-07-12 DIAGNOSIS — I5022 Chronic systolic (congestive) heart failure: Secondary | ICD-10-CM | POA: Diagnosis not present

## 2024-07-12 LAB — BASIC METABOLIC PANEL WITH GFR
Anion gap: 10 (ref 5–15)
BUN: 11 mg/dL (ref 8–23)
CO2: 25 mmol/L (ref 22–32)
Calcium: 8.7 mg/dL — ABNORMAL LOW (ref 8.9–10.3)
Chloride: 99 mmol/L (ref 98–111)
Creatinine, Ser: 0.76 mg/dL (ref 0.61–1.24)
GFR, Estimated: >60 mL/min (ref >60)
Glucose, Bld: 96 mg/dL (ref 70–99)
Potassium: 3.6 mmol/L (ref 3.5–5.1)
Sodium: 134 mmol/L — ABNORMAL LOW (ref 135–145)

## 2024-07-12 LAB — HEPARIN LEVEL (UNFRACTIONATED): Heparin Unfractionated: 0.31 [IU]/mL (ref 0.30–0.70)

## 2024-07-12 LAB — NM MYOCAR MULTI W/SPECT W/WALL MOTION / EF
Base ST Depression (mm): 0 mm
Estimated workload: 1
Exercise duration (min): 1 min
Exercise duration (sec): 0 s
LV dias vol: 135 mL (ref 62–150)
LV sys vol: 77 mL (ref 4.2–5.8)
MPHR: 129 {beats}/min
Nuc Stress EF: 43 %
Peak HR: 92 {beats}/min
Percent HR: 71 %
Rest HR: 77 {beats}/min
Rest Nuclear Isotope Dose: 10.9 mCi
SDS: 1
SRS: 4
SSS: 3
ST Depression (mm): 0 mm
Stress Nuclear Isotope Dose: 30.4 mCi
TID: 0.91

## 2024-07-12 LAB — CBC
HCT: 38.1 % — ABNORMAL LOW (ref 39.0–52.0)
Hemoglobin: 13.1 g/dL (ref 13.0–17.0)
MCH: 33.6 pg (ref 26.0–34.0)
MCHC: 34.4 g/dL (ref 30.0–36.0)
MCV: 97.7 fL (ref 80.0–100.0)
Platelets: 152 K/uL (ref 150–400)
RBC: 3.9 MIL/uL — ABNORMAL LOW (ref 4.22–5.81)
RDW: 12.7 % (ref 11.5–15.5)
WBC: 6.4 K/uL (ref 4.0–10.5)
nRBC: 0 % (ref 0.0–0.2)

## 2024-07-12 MED ORDER — TECHNETIUM TC 99M TETROFOSMIN IV KIT
30.3600 | PACK | Freq: Once | INTRAVENOUS | Status: AC | PRN
  Administered 2024-07-12: 30.36 via INTRAVENOUS

## 2024-07-12 MED ORDER — REGADENOSON 0.4 MG/5ML IV SOLN
0.4000 mg | Freq: Once | INTRAVENOUS | Status: AC
  Administered 2024-07-12: 0.4 mg via INTRAVENOUS

## 2024-07-12 MED ORDER — TECHNETIUM TC 99M TETROFOSMIN IV KIT
10.8600 | PACK | Freq: Once | INTRAVENOUS | Status: AC | PRN
  Administered 2024-07-12: 10.86 via INTRAVENOUS

## 2024-07-12 MED ORDER — ISOSORBIDE MONONITRATE ER 30 MG PO TB24
30.0000 mg | ORAL_TABLET | Freq: Every day | ORAL | Status: DC
  Administered 2024-07-12 – 2024-07-13 (×2): 30 mg via ORAL
  Filled 2024-07-12 (×2): qty 1

## 2024-07-12 MED ORDER — CLOPIDOGREL BISULFATE 75 MG PO TABS
75.0000 mg | ORAL_TABLET | Freq: Every day | ORAL | Status: DC
  Administered 2024-07-12 – 2024-07-13 (×2): 75 mg via ORAL
  Filled 2024-07-12 (×2): qty 1

## 2024-07-12 MED ORDER — ENOXAPARIN SODIUM 40 MG/0.4ML IJ SOSY: 40.0000 mg | PREFILLED_SYRINGE | INTRAMUSCULAR | Status: DC

## 2024-07-12 MED ORDER — EZETIMIBE 10 MG PO TABS
10.0000 mg | ORAL_TABLET | Freq: Every day | ORAL | Status: DC
  Administered 2024-07-12 – 2024-07-13 (×2): 10 mg via ORAL
  Filled 2024-07-12 (×2): qty 1

## 2024-07-12 MED ORDER — APIXABAN 5 MG PO TABS
5.0000 mg | ORAL_TABLET | Freq: Two times a day (BID) | ORAL | Status: DC
  Administered 2024-07-12 – 2024-07-13 (×2): 5 mg via ORAL
  Filled 2024-07-12 (×2): qty 1

## 2024-07-12 MED ORDER — AMLODIPINE BESYLATE 5 MG PO TABS
5.0000 mg | ORAL_TABLET | Freq: Every day | ORAL | Status: DC
  Administered 2024-07-12 – 2024-07-13 (×2): 5 mg via ORAL
  Filled 2024-07-12 (×2): qty 1

## 2024-07-12 MED ORDER — DILTIAZEM HCL ER COATED BEADS 120 MG PO CP24: 120.0000 mg | ORAL_CAPSULE | Freq: Every day | ORAL | Status: DC

## 2024-07-12 NOTE — Care Management Important Message (Signed)
 Important Message  Patient Details  Name: Kenneth Watson MRN: [id number] Date of Birth: August 30, 1931   Important Message Given:  Yes - Medicare IM     [name] 07/12/2024, 1:20 PM

## 2024-07-12 NOTE — Progress Notes (Signed)
 Patient ID: Kenneth Watson, male   DOB: Jan 29, 1931, 89 y.o.   MRN: [id number] [facility] Cardiology    SUBJECTIVE: Patient doing reasonably well has occasional chest and arm pain but only when he takes a deep breath mainly on the left side .  Status post Myoview  feeling much better   Vitals:   07/11/24 2232 07/12/24 0507 07/12/24 0850 07/12/24 1222  BP: 115/65 136/70 131/65 108/84  Pulse: 86 67 (!) 43 69  Resp: 18 18 18 18   Temp: 98.3 F (36.8 C) 98.3 F (36.8 C) 97.9 F (36.6 C) (!) 97.4 F (36.3 C)  TempSrc:   Oral   SpO2: 97% 97% 96% 93%  Weight:      Height:         Intake/Output Summary (Last 24 hours) at 07/12/2024 1529 Last data filed at 07/12/2024 1300 Gross per 24 hour  Intake 180 ml  Output 120 ml  Net 60 ml      PHYSICAL EXAM  General: Well developed, well nourished, in no acute distress HEENT:  Normocephalic and atramatic Neck:  No JVD.  Lungs: Clear bilaterally to auscultation and percussion. Heart: Irregular irregular. Normal S1 and S2 without gallops or murmurs.  Abdomen: Bowel sounds are positive, abdomen soft and non-tender  Msk:  Back normal, normal gait. Normal strength and tone for age. Extremities: No clubbing, cyanosis or edema.   Neuro: Alert and oriented X 3. Psych:  Good affect, responds appropriately   LABS: Basic Metabolic Panel: Recent Labs    07/10/24 0520 07/11/24 0602 07/12/24 0608  NA 139 137 134*  K 4.3 3.5 3.6  CL 102 101 99  CO2 29 28 25   GLUCOSE 105* 107* 96  BUN 11 9 11   CREATININE 0.77 0.82 0.76  CALCIUM 9.2 8.9 8.7*  MG 2.2  --   --   PHOS 3.1  --   --    Liver Function Tests: No results for input(s): AST, ALT, ALKPHOS, BILITOT, PROT, ALBUMIN in the last 72 hours. No results for input(s): LIPASE, AMYLASE in the last 72 hours. CBC: Recent Labs    07/11/24 0602 07/12/24 0608  WBC 6.4 6.4  HGB 13.2 13.1  HCT 39.4 38.1*  MCV 100.3* 97.7  PLT 150 152   Cardiac Enzymes: No results for input(s):  CKTOTAL, CKMB, CKMBINDEX, TROPONINI in the last 72 hours. BNP: Invalid input(s): POCBNP D-Dimer: No results for input(s): DDIMER in the last 72 hours. Hemoglobin A1C: No results for input(s): HGBA1C in the last 72 hours. Fasting Lipid Panel: No results for input(s): CHOL, HDL, LDLCALC, TRIG, CHOLHDL, LDLDIRECT in the last 72 hours. Thyroid Function Tests: Recent Labs    07/10/24 0520  TSH 6.037*   Anemia Panel: No results for input(s): VITAMINB12, FOLATE, FERRITIN, TIBC, IRON, RETICCTPCT in the last 72 hours.  No results found.   Echo mild depressed left ventricular function EF around 45%  TELEMETRY: Atrial fibrillation rate of 85:  ASSESSMENT AND PLAN:  Principal Problem:   Chest pain Active Problems:   Chronic a-fib (HCC)   Chronic systolic CHF (congestive heart failure), NYHA class 2 (HCC)   Gastroesophageal reflux disease   Hyperlipidemia, mixed   Moderate mitral regurgitation   Moderate tricuspid insufficiency   NSTEMI (non-ST elevated myocardial infarction) (HCC)    Plan Angina add nitrates continue beta-blockers at calcium blocker to help with symptom control Non-STEMI recommend Plavix  aspirin for 24 months unable to tolerate statins so we will place on Zetia  beta-blocker Hypertension will continue metoprolol  at amlodipine   Atrial fibrillation rate controlled currently not on long-term anticoagulation we will resume Eliquis  Hyperlipidemia will start Zetia  patient unable to tolerate statins We will defer cardiac cath at this point with small areas apical inferior ischemia and treat medically Cardiomyopathy mild EF around 40 to 45% recommend medical therapy Follow-up with cardiology as an outpatient   Cara JONETTA Lovelace, MD 07/12/2024 3:29 PM

## 2024-07-12 NOTE — Progress Notes (Addendum)
  Progress Note   Patient: Kenneth Watson FMW:[id number] DOB: 1931-03-12 DOA: 07/10/2024     1 DOS: the patient was seen and examined on 07/12/2024   Brief hospital course: Kenneth Watson is a 89 y.o. male with medical history significant of PAF on Eliquis , chronic HFrEF with LVEF 50%, moderate MR, moderate TR, HTN, HLD, presented with new onset of chest pain.  Troponin elevated at 838.  Procalcitonin level less than 0.1. Patient placed on heparin  drip.  Cardiology consult obtained.    Principal Problem:   Chest pain Active Problems:   Chronic a-fib (HCC)   Chronic systolic CHF (congestive heart failure), NYHA class 2 (HCC)   Gastroesophageal reflux disease   Hyperlipidemia, mixed   Moderate mitral regurgitation   Moderate tricuspid insufficiency   NSTEMI (non-ST elevated myocardial infarction) (HCC)   Assessment and Plan: Non-STEMI. Patient had a second chest pain yesterday, troponin elevated at 838, condition consistent with non-STEMI as EKG did not show any ST elevation. Cardiology consult obtained, patient was made to obtain nuclear stress test before heart cath. Continue heparin  drip.  Patient does not have additional chest pain.   Paroxysmal atrial fibrillation. Eliquis  was on hold due to future epidural injection.  Will not restart at discharge. Currently on heparin  drip for NSTEMI.   Moderate mitral regurgitation. Moderate tricuspid regurgitation. Chronic systolic congestive heart failure No evidence of congestive heart failure. Repeat echocardiogram performed 8/21 showed ejection fraction 45 to 50%   Rheumatoid arthritis  continue home medicines.   Positive blood culture Blood culture positive for staph species, most likely contaminant.  Repeat blood cultures appear to be negative.          Subjective:  Doing well today, no chest pain or shortness of breath.  Physical Exam: Vitals:   07/11/24 2232 07/12/24 0507 07/12/24 0850 07/12/24 1222  BP: 115/65  136/70 131/65 108/84  Pulse: 86 67 (!) 43 69  Resp: 18 18 18 18   Temp: 98.3 F (36.8 C) 98.3 F (36.8 C) 97.9 F (36.6 C) (!) 97.4 F (36.3 C)  TempSrc:   Oral   SpO2: 97% 97% 96% 93%  Weight:      Height:       General exam: Appears calm and comfortable  Respiratory system: Clear to auscultation. Respiratory effort normal. Cardiovascular system: Irregular. No JVD, murmurs, rubs, gallops or clicks. No pedal edema. Gastrointestinal system: Abdomen is nondistended, soft and nontender. No organomegaly or masses felt. Normal bowel sounds heard. Central nervous system: Alert and oriented. No focal neurological deficits. Extremities: Symmetric 5 x 5 power. Skin: No rashes, lesions or ulcers Psychiatry: Judgement and insight appear normal. Mood & affect appropriate.    Data Reviewed:  There are no new results to review at this time.  Family Communication: Wife updated at bedside.  Disposition: Status is: Inpatient Remains inpatient appropriate because: Severity of disease, IV treatment, inpatient procedure     Time spent: 35 minutes  Author: Murvin Mana, MD 07/12/2024 12:27 PM  For on call review [email or web address].

## 2024-07-12 NOTE — Plan of Care (Signed)

## 2024-07-12 NOTE — Consult Note (Signed)
 PHARMACY - ANTICOAGULATION CONSULT NOTE  Pharmacy Consult for Heparin  infusion  Indication: chest pain/ACS  Allergies  Allergen Reactions   Elemental Sulfur Hives   Septra [Sulfamethoxazole-Trimethoprim] Hives   Statins Other (See Comments)    Memory issues    Patient Measurements: Height: 5' 10 (177.8 cm) Weight: 72.8 kg (160 lb 6.4 oz) IBW/kg (Calculated) : 73 HEPARIN  DW (KG): 72.8  Vital Signs: Temp: 98.3 F (36.8 C) (08/22 0507) BP: 136/70 (08/22 0507) Pulse Rate: 67 (08/22 0507)  Labs: Recent Labs    07/10/24 0520 07/10/24 0520 07/10/24 0630 07/10/24 1034 07/10/24 1346 07/10/24 2143 07/11/24 0602 07/11/24 0728 07/11/24 1522 07/12/24 0608  HGB 12.8*  --   --   --   --   --  13.2  --   --  13.1  HCT 38.4*  --   --   --   --   --  39.4  --   --  38.1*  PLT 154  --   --   --   --   --  150  --   --  152  APTT  --   --   --   --  28 53*  --   --   --   --   LABPROT  --   --   --   --  14.9  --   --   --   --   --   INR  --   --   --   --  1.1  --   --   --   --   --   HEPARINUNFRC  --    < >  --   --  <0.10*  --   --  0.35 0.35 0.31  CREATININE 0.77  --   --   --   --   --  0.82  --   --  0.76  TROPONINIHS 9  --  19* 272* 838*  --   --   --   --   --    < > = values in this interval not displayed.    Estimated Creatinine Clearance: 61.9 mL/min (by C-G formula based on SCr of 0.76 mg/dL).   Medical History: Past Medical History:  Diagnosis Date   Atrial fibrillation (HCC)    Cancer (HCC) 2006   Squamous cell carcicoma   Cardiomyopathy (HCC)    Congestive heart failure (CHF) (HCC)    GERD (gastroesophageal reflux disease)    Hyperlipidemia    Hypertension    Neuropathy     Medications:  Medications Prior to Admission  Medication Sig Dispense Refill Last Dose/Taking   apixaban  (ELIQUIS ) 5 MG TABS tablet Take 1 tablet by mouth 2 (two) times daily. Pt stated that last time he took eliquis  was Sunday because of a injection that was supposed to take  place today at 2pm.   07/07/2024   gabapentin  (NEURONTIN ) 100 MG capsule Take 100 mg by mouth 2 (two) times daily.   07/09/2024 Evening   hydroxychloroquine  (PLAQUENIL ) 200 MG tablet Take 200 mg by mouth daily.   07/09/2024 Evening   ketoconazole (NIZORAL) 2 % cream Apply 1 Application topically 2 (two) times daily.   07/09/2024   linaclotide  (LINZESS ) 72 MCG capsule Take 72 mcg by mouth daily before breakfast.   07/09/2024 Morning   metoprolol  succinate (TOPROL -XL) 25 MG 24 hr tablet Take 25 mg by mouth daily.   07/09/2024 Morning   montelukast  (SINGULAIR ) 10 MG tablet  07/09/2024   oxyCODONE  (OXY IR/ROXICODONE ) 5 MG immediate release tablet Take 5 mg by mouth 2 (two) times daily as needed.   Taking As Needed   tamsulosin  (FLOMAX ) 0.4 MG CAPS capsule Take 1 capsule (0.4 mg total) by mouth daily. (Patient taking differently: Take 0.4 mg by mouth daily after supper.) 90 capsule 3 07/09/2024 Evening   ammonium lactate (AMLACTIN) 12 % cream Apply topically. (Patient not taking: Reported on 05/13/2024)   Not Taking   erythromycin  ophthalmic ointment Place a 1/2 inch ribbon of ointment into the lower eyelid 3 times day for 7 days. (Patient not taking: Reported on 05/13/2024) 3.5 g 0 Not Taking   Flaxseed Oil (LINSEED OIL) OIL Use 1 capsule once daily (Patient not taking: Reported on 07/10/2024)   Not Taking   fluticasone  (FLONASE ) 50 MCG/ACT nasal spray Place 2 sprays into both nostrils daily. (Patient not taking: Reported on 07/10/2024) 16 g 0 Not Taking   fluticasone -salmeterol (ADVAIR) 100-50 MCG/ACT AEPB Inhale into the lungs. (Patient not taking: Reported on 05/13/2024)   Not Taking   lisinopril (ZESTRIL) 2.5 MG tablet Take by mouth. (Patient not taking: Reported on 07/10/2024)   Not Taking   polyethylene glycol (MIRALAX / GLYCOLAX) 17 g packet Take 17 g by mouth daily as needed for mild constipation. (Patient not taking: Reported on 05/13/2024)   Not Taking   senna-docusate (SENOKOT-S) 8.6-50 MG tablet Take 1  tablet by mouth daily as needed for mild constipation. (Patient not taking: Reported on 05/13/2024)   Not Taking   Scheduled:   gabapentin   100 mg Oral BID   hydroxychloroquine   200 mg Oral Daily   linaclotide   72 mcg Oral QAC breakfast   metoprolol  succinate  50 mg Oral Daily   montelukast   5 mg Oral QHS   tamsulosin   0.4 mg Oral QPC supper   Infusions:   heparin  1,000 Units/hr (07/11/24 1343)    Assessment:  89 y.o. male with medical history significant of PAF on Eliquis , chronic HFrEF with LVEF 50%, moderate MR, moderate TR, HTN, HLD, presented with new onset of chest pain. Trop 272. CBC stable.   Baseline heparin  level was not falsely elevated.   Goal of Therapy:  Heparin  level 0.3-0.7 Monitor platelets by anticoagulation protocol: Yes  8/20 @ 2143 aPTT 53 seconds SUBtherapeutic 8/21 @ 0728 HL 0.35 Therapeutic x 1 8/21 @ 1522 HL 0.35 Therapeutic x 2 8/22 0608 HL 0.31, therapeutic  x 3   Plan: Will continue heparin  infusion at 1000 units/hr.  Recheck heparin  level daily with AM labs. CBC daily while on heparin .   Kenneth Watson, PharmD, MBA 07/12/2024 7:00 AM

## 2024-07-13 DIAGNOSIS — I482 Chronic atrial fibrillation, unspecified: Secondary | ICD-10-CM | POA: Diagnosis not present

## 2024-07-13 DIAGNOSIS — I5022 Chronic systolic (congestive) heart failure: Secondary | ICD-10-CM | POA: Diagnosis not present

## 2024-07-13 DIAGNOSIS — I214 Non-ST elevation (NSTEMI) myocardial infarction: Secondary | ICD-10-CM | POA: Diagnosis not present

## 2024-07-13 LAB — CBC
HCT: 36.6 % — ABNORMAL LOW (ref 39.0–52.0)
Hemoglobin: 12.5 g/dL — ABNORMAL LOW (ref 13.0–17.0)
MCH: 33.3 pg (ref 26.0–34.0)
MCHC: 34.2 g/dL (ref 30.0–36.0)
MCV: 97.6 fL (ref 80.0–100.0)
Platelets: 152 K/uL (ref 150–400)
RBC: 3.75 MIL/uL — ABNORMAL LOW (ref 4.22–5.81)
RDW: 12.6 % (ref 11.5–15.5)
WBC: 7.3 K/uL (ref 4.0–10.5)
nRBC: 0 % (ref 0.0–0.2)

## 2024-07-13 MED ORDER — AMLODIPINE BESYLATE 5 MG PO TABS: 5.0000 mg | ORAL_TABLET | Freq: Every day | ORAL | 0 refills | Status: AC

## 2024-07-13 MED ORDER — EZETIMIBE 10 MG PO TABS: 10.0000 mg | ORAL_TABLET | Freq: Every day | ORAL | 0 refills | Status: AC

## 2024-07-13 MED ORDER — ISOSORBIDE MONONITRATE ER 30 MG PO TB24: 30.0000 mg | ORAL_TABLET | Freq: Every day | ORAL | 0 refills | Status: AC

## 2024-07-13 MED ORDER — CLOPIDOGREL BISULFATE 75 MG PO TABS: 75.0000 mg | ORAL_TABLET | Freq: Every day | ORAL | 0 refills | Status: AC

## 2024-07-13 NOTE — Discharge Summary (Signed)
 Physician Discharge Summary   Patient: Kenneth Watson MRN: [id number] DOB: [birth year]  Admit date:     07/10/2024  Discharge date: 07/13/24  Discharge Physician: Murvin Mana   PCP: Eliverto Bette Hover, MD   Recommendations at discharge:   Follow-up with PCP in 1 week. Follow-up with cardiology in 1 week.  Discharge Diagnoses: Principal Problem:   Chest pain Active Problems:   Chronic a-fib (HCC)   Chronic systolic CHF (congestive heart failure), NYHA class 2 (HCC)   Gastroesophageal reflux disease   Hyperlipidemia, mixed   Moderate mitral regurgitation   Moderate tricuspid insufficiency   NSTEMI (non-ST elevated myocardial infarction) (HCC) Mild hyponatremia Resolved Problems:   * No resolved hospital problems. *  Hospital Course: Kenneth Watson is a 89 y.o. male with medical history significant of PAF on Eliquis , chronic HFrEF with LVEF 50%, moderate MR, moderate TR, HTN, HLD, presented with new onset of chest pain.  Troponin elevated at 838.  Procalcitonin level less than 0.1. Patient placed on heparin  drip.  Cardiology consult obtained. Cardiac PET scan showed a small area of ischemic changes in the apical area, cardiac cath with deferred. Patient will be discharged home today with a stable condition and to follow-up with cardiology as outpatient.   Assessment and Plan: Non-STEMI. Patient had a second chest pain yesterday, troponin elevated at 838, condition consistent with non-STEMI as EKG did not show any ST elevation. Patient had a stress test with PET scan, showed a small area of ischemic changes in the apical area.  Due to small area of ischemia, heart cath was deferred.  Patient has completed a course of heparin  drip.  Will be discharged home today in a stable condition.  Medication adjusted per cardiology.  Patient also had a scheduled appointment with her cardiologist in 1 week.    Paroxysmal atrial fibrillation. Cardiology has restarted her Eliquis .  The  neurological procedure is about a month away.  Patient will be followed by cardiology.   Moderate mitral regurgitation. Moderate tricuspid regurgitation. Chronic systolic congestive heart failure No evidence of congestive heart failure. Repeat echocardiogram performed 8/21 showed ejection fraction 45 to 50% No exacerbation of congestive heart failure   Rheumatoid arthritis  continue home medicines.   Positive blood culture Blood culture positive for staph species, which has been confirmed to be contaminant.  Repeat blood cultures are negative.       Consultants: Cardiology Procedures performed: None  Disposition: Home Diet recommendation:  Discharge Diet Orders (From admission, onward)     Start     Ordered   07/13/24 0000  Diet - low sodium heart healthy        07/13/24 0925           Cardiac diet DISCHARGE MEDICATION: Allergies as of 07/13/2024       Reactions   Elemental Sulfur Hives   Septra [sulfamethoxazole-trimethoprim] Hives   Statins Other (See Comments)   Memory issues        Medication List     STOP taking these medications    ammonium lactate 12 % cream Commonly known as: AMLACTIN   erythromycin  ophthalmic ointment   fluticasone  50 MCG/ACT nasal spray Commonly known as: FLONASE    fluticasone -salmeterol 100-50 MCG/ACT Aepb Commonly known as: ADVAIR   Linseed Oil Oil   lisinopril 2.5 MG tablet Commonly known as: ZESTRIL   polyethylene glycol 17 g packet Commonly known as: MIRALAX / GLYCOLAX   senna-docusate 8.6-50 MG tablet Commonly known as: Senokot-S  TAKE these medications    amLODipine  5 MG tablet Commonly known as: NORVASC  Take 1 tablet (5 mg total) by mouth daily. Start taking on: July 14, 2024   clopidogrel  75 MG tablet Commonly known as: PLAVIX  Take 1 tablet (75 mg total) by mouth daily. Start taking on: July 14, 2024   Eliquis  5 MG Tabs tablet Generic drug: apixaban  Take 1 tablet by mouth 2 (two)  times daily. Pt stated that last time he took eliquis  was Sunday because of a injection that was supposed to take place today at 2pm.   ezetimibe  10 MG tablet Commonly known as: ZETIA  Take 1 tablet (10 mg total) by mouth daily. Start taking on: July 14, 2024   gabapentin  100 MG capsule Commonly known as: NEURONTIN  Take 100 mg by mouth 2 (two) times daily.   hydroxychloroquine  200 MG tablet Commonly known as: PLAQUENIL  Take 200 mg by mouth daily.   isosorbide  mononitrate 30 MG 24 hr tablet Commonly known as: IMDUR  Take 1 tablet (30 mg total) by mouth daily. Start taking on: July 14, 2024   ketoconazole 2 % cream Commonly known as: NIZORAL Apply 1 Application topically 2 (two) times daily.   linaclotide  72 MCG capsule Commonly known as: LINZESS  Take 72 mcg by mouth daily before breakfast.   metoprolol  succinate 25 MG 24 hr tablet Commonly known as: TOPROL -XL Take 25 mg by mouth daily.   montelukast  10 MG tablet Commonly known as: SINGULAIR    oxyCODONE  5 MG immediate release tablet Commonly known as: Oxy IR/ROXICODONE  Take 5 mg by mouth 2 (two) times daily as needed.   tamsulosin  0.4 MG Caps capsule Commonly known as: FLOMAX  Take 1 capsule (0.4 mg total) by mouth daily. What changed: when to take this        Follow-up Information     Alluri, Krishna C, MD. Go in 1 week(s).   Specialty: Cardiology Contact information: [address] [place] [address] [phone]         Eliverto Bette Hover, MD Follow up in 1 week(s).   Specialty: Family Medicine Contact information: [address] [place] [address] [phone]                Discharge Exam: Kenneth Watson   07/10/24 0451 07/10/24 2215  Weight: 72 kg 72.8 kg   General exam: Appears calm and comfortable  Respiratory system: Clear to auscultation. Respiratory effort normal. Cardiovascular system: S1 & S2 heard, RRR. No JVD, murmurs, rubs, gallops or clicks. No pedal  edema. Gastrointestinal system: Abdomen is nondistended, soft and nontender. No organomegaly or masses felt. Normal bowel sounds heard. Central nervous system: Alert and oriented. No focal neurological deficits. Extremities: Symmetric 5 x 5 power. Skin: No rashes, lesions or ulcers Psychiatry: Judgement and insight appear normal. Mood & affect appropriate.    Condition at discharge: good  The results of significant diagnostics from this hospitalization (including imaging, microbiology, ancillary and laboratory) are listed below for reference.   Imaging Studies: NM Myocar Multi W/Spect W/Wall Motion / EF Result Date: 07/12/2024   Findings are consistent with ischemia. The study is intermediate risk.   No ST deviation was noted.   LV perfusion is abnormal. There is evidence of ischemia. There is no evidence of infarction. Defect 1: There is a small defect with mild reduction in uptake present in the apical inferior location(s) that is partially reversible. There is abnormal wall motion in the defect area. Consistent with ischemia.   Left ventricular function is  abnormal. Global function is mildly reduced. End diastolic cavity size is moderately enlarged. Conclusion Abnormal myocardial perfusion scan Small area of apical inferior ischemia Left ventricular enlargement Mildly reduced overall left ventricular function EF around 40 to 45% Since this is a small area recommend conservative medical therapy rather than invasive strategy Recommend antianginals blood pressure control statin therapy   ECHOCARDIOGRAM COMPLETE Result Date: 07/10/2024    ECHOCARDIOGRAM REPORT   Patient Name:   BEAUDEN TREMONT Date of Exam: 07/10/2024 Medical Rec #:  [id number]      Height:       70.0 in Accession #:    [id number]     Weight:       158.7 lb Date of Birth:  [birth year]      BSA:          1.892 m Patient Age:    89 years       BP:           123/74 mmHg Patient Gender: M              HR:           86 bpm. Exam Location:   [facility] Procedure: 2D Echo, Cardiac Doppler and Color Doppler (Both Spectral and Color            Flow Doppler were utilized during procedure). Indications:     Chest pain R07.9  History:         Patient has no prior history of Echocardiogram examinations.                  Cardiomyopathy, Arrythmias:Atrial Fibrillation; Risk                  Factors:Hypertension and Dyslipidemia.  Sonographer:     Christopher Furnace Referring Phys:  [id number] CORT ONEIDA MANA Diagnosing Phys: Cara JONETTA Lovelace MD IMPRESSIONS  1. Left ventricular ejection fraction, by estimation, is 45 to 50%. The left ventricle has mildly decreased function. The left ventricle demonstrates global hypokinesis. The left ventricular internal cavity size was severely dilated. Left ventricular diastolic function could not be evaluated.  2. Right ventricular systolic function is low normal. The right ventricular size is mildly enlarged.  3. Left atrial size was moderately dilated.  4. The mitral valve is myxomatous. Mild mitral valve regurgitation.  5. Tricuspid valve regurgitation is mild to moderate.  6. The aortic valve is grossly normal. Aortic valve regurgitation is trivial. Aortic valve sclerosis/calcification is present, without any evidence of aortic stenosis. FINDINGS  Left Ventricle: Left ventricular ejection fraction, by estimation, is 45 to 50%. The left ventricle has mildly decreased function. The left ventricle demonstrates global hypokinesis. Strain was performed and the global longitudinal strain is indeterminate. The left ventricular internal cavity size was severely dilated. There is borderline left ventricular hypertrophy. Left ventricular diastolic function could not be evaluated. Right Ventricle: The right ventricular size is mildly enlarged. No increase in right ventricular wall thickness. Right ventricular systolic function is low normal. Left Atrium: Left atrial size was moderately dilated. Right Atrium: Right atrial size was normal in size.  Pericardium: There is no evidence of pericardial effusion. Mitral Valve: The mitral valve is myxomatous. Mild mitral valve regurgitation. Tricuspid Valve: The tricuspid valve is grossly normal. Tricuspid valve regurgitation is mild to moderate. Aortic Valve: The aortic valve is grossly normal. Aortic valve regurgitation is trivial. Aortic valve sclerosis/calcification is present, without any evidence of aortic stenosis. Aortic valve mean gradient measures 1.0 mmHg. Aortic valve peak  gradient measures 2.4 mmHg. Aortic valve area, by VTI measures 3.86 cm. Pulmonic Valve: The pulmonic valve was normal in structure. Pulmonic valve regurgitation is not visualized. Aorta: The ascending aorta was not well visualized. IAS/Shunts: No atrial level shunt detected by color flow Doppler. Additional Comments: 3D was performed not requiring image post processing on an independent workstation and was indeterminate.  LEFT VENTRICLE PLAX 2D LVIDd:         5.90 cm      Diastology LVIDs:         4.40 cm      LV e' medial:   9.46 cm/s LV PW:         1.10 cm      LV E/e' medial: 7.3 LV IVS:        0.80 cm LVOT diam:     2.10 cm LV SV:         47 LV SV Index:   25 LVOT Area:     3.46 cm  LV Volumes (MOD) LV vol d, MOD A2C: 137.0 ml LV vol d, MOD A4C: 128.0 ml LV vol s, MOD A2C: 70.0 ml LV vol s, MOD A4C: 65.3 ml LV SV MOD A2C:     67.0 ml LV SV MOD A4C:     128.0 ml LV SV MOD BP:      62.2 ml RIGHT VENTRICLE RV Basal diam:  4.80 cm RV Mid diam:    3.80 cm LEFT ATRIUM              Index        RIGHT ATRIUM           Index LA diam:        5.20 cm  2.75 cm/m   RA Area:     28.60 cm LA Vol (A2C):   112.0 ml 59.19 ml/m  RA Volume:   90.40 ml  47.78 ml/m LA Vol (A4C):   97.3 ml  51.43 ml/m LA Biplane Vol: 109.0 ml 57.61 ml/m  AORTIC VALVE AV Area (Vmax):    2.91 cm AV Area (Vmean):   2.73 cm AV Area (VTI):     3.86 cm AV Vmax:           78.00 cm/s AV Vmean:          52.700 cm/s AV VTI:            0.121 m AV Peak Grad:      2.4 mmHg  AV Mean Grad:      1.0 mmHg LVOT Vmax:         65.50 cm/s LVOT Vmean:        41.600 cm/s LVOT VTI:          0.135 m LVOT/AV VTI ratio: 1.12  AORTA Ao Root diam: 3.60 cm MITRAL VALVE               TRICUSPID VALVE MV Area (PHT): 1.85 cm    TR Peak grad:   40.4 mmHg MV Decel Time: 409 msec    TR Vmax:        318.00 cm/s MV E velocity: 69.20 cm/s                            SHUNTS                            Systemic VTI:  0.14  m                            Systemic Diam: 2.10 cm Cara JONETTA Lovelace MD Electronically signed by Cara JONETTA Lovelace MD Signature Date/Time: 07/10/2024/5:05:59 PM    Final    CT CHEST WO CONTRAST Result Date: 07/10/2024 CLINICAL DATA:  Pneumonia EXAM: CT CHEST WITHOUT CONTRAST TECHNIQUE: Multidetector CT imaging of the chest was performed following the standard protocol without IV contrast. RADIATION DOSE REDUCTION: This exam was performed according to the departmental dose-optimization program which includes automated exposure control, adjustment of the mA and/or kV according to patient size and/or use of iterative reconstruction technique. COMPARISON:  04/15/2018 and chest radiograph 07/10/2024 FINDINGS: Cardiovascular: Coronary, aortic arch, and branch vessel atherosclerotic vascular disease. Moderate cardiomegaly with right heart predominance. Mediastinum/Nodes: Unremarkable Lungs/Pleura: Biapical pleuroparenchymal scarring. Scarring or atelectasis peripherally in the right lower lobe on image 137 series 3. Volume loss and mild scarring or atelectasis in the left lower lobe. Dense calcified left lower lobe granuloma appears unchanged. Stable left lower lobe subpleural lymph node on image 74 series 3. Upper Abdomen: Punctate calcifications throughout the spleen compatible with old granulomatous disease. Musculoskeletal: Stable right posterolateral seventh and eighth rib deformity. IMPRESSION: 1. No findings of pneumonia. 2. Moderate cardiomegaly with right heart predominance. 3. Stable right  posterolateral seventh and eighth rib deformity. 4. Old granulomatous disease. 5.  Aortic Atherosclerosis (ICD10-I70.0).  Coronary atherosclerosis. Electronically Signed   By: Ryan Salvage M.D.   On: 07/10/2024 10:56   DG Chest Port 1 View Result Date: 07/10/2024 CLINICAL DATA:  Chest pain EXAM: PORTABLE CHEST 1 VIEW COMPARISON:  09/15/2022 FINDINGS: The cardio pericardial silhouette is enlarged. Interstitial markings are diffusely coarsened with chronic features. Subtle patchy opacities seen in the parahilar and lower right lung. No acute bony abnormality. Telemetry leads overlie the chest. IMPRESSION: Subtle patchy opacities in the parahilar and lower right lung. Pneumonia not excluded. Follow-up recommended to ensure resolution. Electronically Signed   By: Camellia Candle M.D.   On: 07/10/2024 05:25    Microbiology: Results for orders placed or performed during the hospital encounter of 07/10/24  Resp panel by RT-PCR (RSV, Flu A&B, Covid) Anterior Nasal Swab     Status: None   Collection Time: 07/10/24  5:46 AM   Specimen: Anterior Nasal Swab  Result Value Ref Range Status   SARS Coronavirus 2 by RT PCR NEGATIVE NEGATIVE Final    Comment: (NOTE) SARS-CoV-2 target nucleic acids are NOT DETECTED.  The SARS-CoV-2 RNA is generally detectable in upper respiratory specimens during the acute phase of infection. The lowest concentration of SARS-CoV-2 viral copies this assay can detect is 138 copies/mL. A negative result does not preclude SARS-Cov-2 infection and should not be used as the sole basis for treatment or other patient management decisions. A negative result may occur with  improper specimen collection/handling, submission of specimen other than nasopharyngeal swab, presence of viral mutation(s) within the areas targeted by this assay, and inadequate number of viral copies(<138 copies/mL). A negative result must be combined with clinical observations, patient history, and  epidemiological information. The expected result is Negative.  Fact Sheet for Patients:  BloggerCourse.com  Fact Sheet for Healthcare Providers:  SeriousBroker.it  This test is no t yet approved or cleared by the [place]  FDA and  has been authorized for detection and/or diagnosis of SARS-CoV-2 by FDA under an Emergency Use Authorization (EUA). This EUA will remain  in effect (  meaning this test can be used) for the duration of the COVID-19 declaration under Section 564(b)(1) of the Act, 21 U.S.C.section 360bbb-3(b)(1), unless the authorization is terminated  or revoked sooner.       Influenza A by PCR NEGATIVE NEGATIVE Final   Influenza B by PCR NEGATIVE NEGATIVE Final    Comment: (NOTE) The Xpert Xpress SARS-CoV-2/FLU/RSV plus assay is intended as an aid in the diagnosis of influenza from Nasopharyngeal swab specimens and should not be used as a sole basis for treatment. Nasal washings and aspirates are unacceptable for Xpert Xpress SARS-CoV-2/FLU/RSV testing.  Fact Sheet for Patients: BloggerCourse.com  Fact Sheet for Healthcare Providers: SeriousBroker.it  This test is not yet approved or cleared by the [place]  FDA and has been authorized for detection and/or diagnosis of SARS-CoV-2 by FDA under an Emergency Use Authorization (EUA). This EUA will remain in effect (meaning this test can be used) for the duration of the COVID-19 declaration under Section 564(b)(1) of the Act, 21 U.S.C. section 360bbb-3(b)(1), unless the authorization is terminated or revoked.     Resp Syncytial Virus by PCR NEGATIVE NEGATIVE Final    Comment: (NOTE) Fact Sheet for Patients: BloggerCourse.com  Fact Sheet for Healthcare Providers: SeriousBroker.it  This test is not yet approved or cleared by the [place]  FDA and has been  authorized for detection and/or diagnosis of SARS-CoV-2 by FDA under an Emergency Use Authorization (EUA). This EUA will remain in effect (meaning this test can be used) for the duration of the COVID-19 declaration under Section 564(b)(1) of the Act, 21 U.S.C. section 360bbb-3(b)(1), unless the authorization is terminated or revoked.  Performed at [facility], [address] [place], [place] [address]   Blood culture (routine x 2)     Status: Abnormal (Preliminary result)   Collection Time: 07/10/24 10:35 AM   Specimen: BLOOD LEFT ARM  Result Value Ref Range Status   Specimen Description   Final    BLOOD LEFT ARM Performed at [facility], [address], [place], [place] [address]    Special Requests   Final    BOTTLES DRAWN AEROBIC AND ANAEROBIC Blood Culture results may not be optimal due to an inadequate volume of blood received in culture bottles Performed at [facility], [address], [place], [place] [address]    Culture  Setup Time   Final    GRAM POSITIVE COCCI IN BOTH AEROBIC AND ANAEROBIC BOTTLES CRITICAL VALUE NOTED.  VALUE IS CONSISTENT WITH PREVIOUSLY REPORTED AND CALLED VALUE. GRAM STAIN REVIEWED-AGREE WITH RESULT DRT Performed at [facility], [address], [place], [place] [address]    Culture (A)  Final    STAPHYLOCOCCUS CAPITIS STAPHYLOCOCCUS EPIDERMIDIS    Report Status PENDING  Incomplete  Blood culture (routine x 2)     Status: Abnormal (Preliminary result)   Collection Time: 07/10/24 10:35 AM   Specimen: BLOOD RIGHT ARM  Result Value Ref Range Status   Specimen Description   Final    BLOOD RIGHT ARM Performed at [facility], [address] [place], [place] [address]    Special Requests   Final    BOTTLES DRAWN AEROBIC AND ANAEROBIC Blood Culture results may not be optimal due to an inadequate volume of blood received in culture bottles Performed at [facility], [address],  [place], [place] [address]    Culture  Setup Time   Final    GRAM POSITIVE COCCI IN BOTH AEROBIC AND ANAEROBIC BOTTLES  CRITICAL RESULT CALLED TO, READ BACK BY AND VERIFIED WITHBETHA RANKIN HIGHMAN AT 9371 07/11/24 JG Performed at [facility], [address], [place], [place] [address]    Culture (A)  Final    STAPHYLOCOCCUS HAEMOLYTICUS STAPHYLOCOCCUS CAPITIS CULTURE REINCUBATED FOR BETTER GROWTH Performed at [facility], [address], [place], [place] [address]    Report Status PENDING  Incomplete  Blood Culture ID Panel (Reflexed)     Status: Abnormal   Collection Time: 07/10/24 10:35 AM  Result Value Ref Range Status   Enterococcus faecalis NOT DETECTED NOT DETECTED Final   Enterococcus Faecium NOT DETECTED NOT DETECTED Final   Listeria monocytogenes NOT DETECTED NOT DETECTED Final   Staphylococcus species DETECTED (A) NOT DETECTED Final    Comment: CRITICAL RESULT CALLED TO, READ BACK BY AND VERIFIED WITH:  NATHAN BELEU AT 9371 07/11/24 JG    Staphylococcus aureus (BCID) NOT DETECTED NOT DETECTED Final   Staphylococcus epidermidis NOT DETECTED NOT DETECTED Final   Staphylococcus lugdunensis NOT DETECTED NOT DETECTED Final   Streptococcus species NOT DETECTED NOT DETECTED Final   Streptococcus agalactiae NOT DETECTED NOT DETECTED Final   Streptococcus pneumoniae NOT DETECTED NOT DETECTED Final   Streptococcus pyogenes NOT DETECTED NOT DETECTED Final   A.calcoaceticus-baumannii NOT DETECTED NOT DETECTED Final   Bacteroides fragilis NOT DETECTED NOT DETECTED Final   Enterobacterales NOT DETECTED NOT DETECTED Final   Enterobacter cloacae complex NOT DETECTED NOT DETECTED Final   Escherichia coli NOT DETECTED NOT DETECTED Final   Klebsiella aerogenes NOT DETECTED NOT DETECTED Final   Klebsiella oxytoca NOT DETECTED NOT DETECTED Final   Klebsiella pneumoniae NOT DETECTED NOT DETECTED Final   Proteus species NOT DETECTED NOT DETECTED Final   Salmonella species NOT  DETECTED NOT DETECTED Final   Serratia marcescens NOT DETECTED NOT DETECTED Final   Haemophilus influenzae NOT DETECTED NOT DETECTED Final   Neisseria meningitidis NOT DETECTED NOT DETECTED Final   Pseudomonas aeruginosa NOT DETECTED NOT DETECTED Final   Stenotrophomonas maltophilia NOT DETECTED NOT DETECTED Final   Candida albicans NOT DETECTED NOT DETECTED Final   Candida auris NOT DETECTED NOT DETECTED Final   Candida glabrata NOT DETECTED NOT DETECTED Final   Candida krusei NOT DETECTED NOT DETECTED Final   Candida parapsilosis NOT DETECTED NOT DETECTED Final   Candida tropicalis NOT DETECTED NOT DETECTED Final   Cryptococcus neoformans/gattii NOT DETECTED NOT DETECTED Final    Comment: Performed at [facility], [address] [place], [place] [address]  Culture, blood (Routine X 2) w Reflex to ID Panel     Status: None (Preliminary result)   Collection Time: 07/11/24  1:41 PM   Specimen: BLOOD  Result Value Ref Range Status   Specimen Description BLOOD BLOOD LEFT FOREARM  Final   Special Requests   Final    BOTTLES DRAWN AEROBIC AND ANAEROBIC Blood Culture results may not be optimal due to an inadequate volume of blood received in culture bottles   Culture   Final    NO GROWTH 2 DAYS Performed at [facility], [address] [place], [place] [address]    Report Status PENDING  Incomplete  Culture, blood (Routine X 2) w Reflex to ID Panel     Status: None (Preliminary result)   Collection Time: 07/11/24  1:41 PM   Specimen: BLOOD  Result Value Ref Range Status   Specimen Description BLOOD BLOOD LEFT HAND  Final   Special Requests   Final  BOTTLES DRAWN AEROBIC AND ANAEROBIC Blood Culture adequate volume   Culture   Final    NO GROWTH 2 DAYS Performed at [facility], [address] [place], [place] [address]    Report Status PENDING  Incomplete    Labs: CBC: Recent Labs  Lab 07/10/24 0520 07/11/24 0602 07/12/24 0608 07/13/24 0541   WBC 6.3 6.4 6.4 7.3  HGB 12.8* 13.2 13.1 12.5*  HCT 38.4* 39.4 38.1* 36.6*  MCV 101.1* 100.3* 97.7 97.6  PLT 154 150 152 152   Basic Metabolic Panel: Recent Labs  Lab 07/10/24 0520 07/11/24 0602 07/12/24 0608  NA 139 137 134*  K 4.3 3.5 3.6  CL 102 101 99  CO2 29 28 25   GLUCOSE 105* 107* 96  BUN 11 9 11   CREATININE 0.77 0.82 0.76  CALCIUM 9.2 8.9 8.7*  MG 2.2  --   --   PHOS 3.1  --   --    Liver Function Tests: No results for input(s): AST, ALT, ALKPHOS, BILITOT, PROT, ALBUMIN in the last 168 hours. CBG: No results for input(s): GLUCAP in the last 168 hours.  Discharge time spent: 35 minutes.  Signed: Murvin Mana, MD [facility] 07/13/2024

## 2024-07-13 NOTE — Plan of Care (Signed)
   Problem: Education: Goal: Understanding of cardiac disease, CV risk reduction, and recovery process will improve Outcome: Progressing   Problem: Activity: Goal: Ability to tolerate increased activity will improve Outcome: Progressing   Problem: Cardiac: Goal: Ability to achieve and maintain adequate cardiovascular perfusion will improve Outcome: Progressing

## 2024-07-13 NOTE — Progress Notes (Signed)
 Nsg Discharge Note  Admit Date:  07/10/2024 Discharge date: 07/13/2024   Kenneth Watson to be D/C'd Home per MD order.  AVS completed.  Copy for chart, and copy for patient signed, and dated. Patient/caregiver able to verbalize understanding.  IV catheter discontinued intact. Site without signs and symptoms of complications - no redness or edema noted at insertion site, patient denies c/o pain - only slight tenderness at site.  Dressing with slight pressure applied.  D/c Instructions-Education: Discharge instructions given to patient/family with verbalized understanding. D/c education completed with patient/family including follow up instructions, medication list, d/c activities limitations if indicated, with other d/c instructions as indicated by MD - patient able to verbalize understanding, all questions fully answered. Patient instructed to return to ED, call 911, or call MD for any changes in condition.  Patient escorted via WC, and D/C home via private auto.

## 2024-07-14 LAB — CULTURE, BLOOD (ROUTINE X 2)

## 2024-07-16 LAB — CULTURE, BLOOD (ROUTINE X 2)
Culture: NO GROWTH
Culture: NO GROWTH
Special Requests: ADEQUATE

## 2024-10-16 ENCOUNTER — Emergency Department
Admission: EM | Admit: 2024-10-16 | Discharge: 2024-10-16 | Disposition: A | Discharge status: Home / Self Care | Attending: Emergency Medicine | Admitting: Emergency Medicine

## 2024-10-16 ENCOUNTER — Emergency Department

## 2024-10-16 ENCOUNTER — Other Ambulatory Visit: Payer: Self-pay

## 2024-10-16 DIAGNOSIS — S0083XA Contusion of other part of head, initial encounter: Secondary | ICD-10-CM

## 2024-10-16 DIAGNOSIS — W01198A Fall on same level from slipping, tripping and stumbling with subsequent striking against other object, initial encounter: Secondary | ICD-10-CM | POA: Diagnosis not present

## 2024-10-16 DIAGNOSIS — W19XXXA Unspecified fall, initial encounter: Secondary | ICD-10-CM

## 2024-10-16 DIAGNOSIS — S0990XA Unspecified injury of head, initial encounter: Secondary | ICD-10-CM | POA: Diagnosis present

## 2024-10-16 DIAGNOSIS — Z85828 Personal history of other malignant neoplasm of skin: Secondary | ICD-10-CM | POA: Diagnosis not present

## 2024-10-16 DIAGNOSIS — Y92512 Supermarket, store or market as the place of occurrence of the external cause: Secondary | ICD-10-CM | POA: Diagnosis not present

## 2024-10-16 DIAGNOSIS — I5020 Unspecified systolic (congestive) heart failure: Secondary | ICD-10-CM | POA: Insufficient documentation

## 2024-10-16 DIAGNOSIS — I110 Hypertensive heart disease with heart failure: Secondary | ICD-10-CM | POA: Insufficient documentation

## 2024-10-16 DIAGNOSIS — S0081XA Abrasion of other part of head, initial encounter: Secondary | ICD-10-CM

## 2024-10-16 DIAGNOSIS — Y9301 Activity, walking, marching and hiking: Secondary | ICD-10-CM | POA: Diagnosis not present

## 2024-10-16 DIAGNOSIS — S0181XA Laceration without foreign body of other part of head, initial encounter: Secondary | ICD-10-CM | POA: Insufficient documentation

## 2024-10-16 LAB — COMPREHENSIVE METABOLIC PANEL WITH GFR
ALT: 8 U/L (ref 0–44)
AST: 22 U/L (ref 15–41)
Albumin: 4.3 g/dL (ref 3.5–5.0)
Alkaline Phosphatase: 60 U/L (ref 38–126)
Anion gap: 10 (ref 5–15)
BUN: 19 mg/dL (ref 8–23)
CO2: 27 mmol/L (ref 22–32)
Calcium: 9.5 mg/dL (ref 8.9–10.3)
Chloride: 100 mmol/L (ref 98–111)
Creatinine, Ser: 0.88 mg/dL (ref 0.61–1.24)
GFR, Estimated: >60 mL/min (ref >60)
Glucose, Bld: 105 mg/dL — ABNORMAL HIGH (ref 70–99)
Potassium: 3.9 mmol/L (ref 3.5–5.1)
Sodium: 137 mmol/L (ref 135–145)
Total Bilirubin: 0.7 mg/dL (ref 0.0–1.2)
Total Protein: 6.9 g/dL (ref 6.5–8.1)

## 2024-10-16 LAB — CBC
HCT: 38.3 % — ABNORMAL LOW (ref 39.0–52.0)
Hemoglobin: 13.1 g/dL (ref 13.0–17.0)
MCH: 33.5 pg (ref 26.0–34.0)
MCHC: 34.2 g/dL (ref 30.0–36.0)
MCV: 98 fL (ref 80.0–100.0)
Platelets: 187 K/uL (ref 150–400)
RBC: 3.91 MIL/uL — ABNORMAL LOW (ref 4.22–5.81)
RDW: 13.2 % (ref 11.5–15.5)
WBC: 7.8 K/uL (ref 4.0–10.5)
nRBC: 0 % (ref 0.0–0.2)

## 2024-10-16 LAB — PROTIME-INR
INR: 1.1 (ref 0.8–1.2)
Prothrombin Time: 15.1 s (ref 11.4–15.2)

## 2024-10-16 NOTE — ED Triage Notes (Signed)
 AOX4.. Pt was walking out of a store and stepped off sidewalk and didn't see it there. Patient hit his head, big hematoma to the right side of his face. Eliquis  and Plavix . Hx A-fib, heart attack. Minimal pain at this time.  CBG: 97

## 2024-10-16 NOTE — ED Provider Notes (Signed)
 [facility] Provider Note    Event Date/Time   First MD Initiated Contact with Patient 10/16/24 1508     (approximate)   History   No chief complaint on file.    HPI  Kenneth Watson is a 89 y.o. male    with a past medical history of NSTEMI,  right-sided sciatica, chronic A-fib, spinal stenosis of lumbar sacral region, lumbar radiculopathy, piriformis muscle pain.  Dizziness, spondylosis of cervical spine, V. tach, chronic systolic cardiac heart failure, essential hypertension, bilateral carotid artery stenosis,who presents to the ED complaining of fall. According to the patient, he was walking in the street and off the sidewalk.  Patient endorses he did not see it.  Patient denies loss of consciousness, blurry vision, vomit.  Patient is taking Eliquis  and Plavix .  Patient presents with right forehead hematoma, nasal abrasion of the skin, upper lip of the skin of the skin.  Patient is here with his wife.    Patient Active Problem List   Diagnosis Date Noted   NSTEMI (non-ST elevated myocardial infarction) (HCC) 07/11/2024   Chest pain 07/10/2024   Squamous cell carcinoma 03/26/2022   V-tach (HCC) 03/09/2022   Cervical facet syndrome 10/25/2021   Personal history of other malignant neoplasm of skin 08/18/2021   Bilateral carotid artery disease 12/16/2020   Moderate mitral regurgitation 11/18/2020   Chronic systolic CHF (congestive heart failure), NYHA class 2 (HCC) 11/27/2017   Bilateral hearing loss 10/25/2017   Bilateral impacted cerumen 10/25/2017   Chronic hand pain, right 04/04/2017   Primary osteoarthritis of right hand 04/04/2017   Essential hypertension 03/23/2017   Gastroesophageal reflux disease 03/23/2017   Nasal congestion with rhinorrhea 03/23/2017   ED (erectile dysfunction) of organic origin 06/28/2016   Frequent PVCs 05/19/2015   Moderate aortic valve insufficiency 05/12/2015   Moderate tricuspid insufficiency 01/07/2015   Chronic  a-fib (HCC) 09/03/2014   Dermatochalasis 02/25/2014   Mechanical ptosis 01/21/2014   Pseudophakia of both eyes 04/12/2013   Bicipital tenosynovitis 12/28/2012   Rotator cuff tear 11/02/2012   Hearing loss, sensorineural, asymmetrical 10/24/2012   BPH (benign prostatic hyperplasia) 09/25/2012   Shoulder sprain 09/17/2012   Hyperlipidemia, mixed 06/03/2011   Low back pain 06/03/2011   Nocturia 06/03/2011     Physical Exam   Triage Vital Signs: ED Triage Vitals  Encounter Vitals Group     BP 10/16/24 1503 (!) 167/127     Girls Systolic BP Percentile --      Girls Diastolic BP Percentile --      Boys Systolic BP Percentile --      Boys Diastolic BP Percentile --      Pulse Rate 10/16/24 1454 82     Resp 10/16/24 1454 17     Temp 10/16/24 1454 98 F (36.7 C)     Temp src --      SpO2 10/16/24 1454 96 %     Weight 10/16/24 1455 165 lb (74.8 kg)     Height 10/16/24 1455 5' 10 (1.778 m)     Head Circumference --      Peak Flow --      Pain Score 10/16/24 1455 2     Pain Loc --      Pain Education --      Exclude from Growth Chart --     Most recent vital signs: Vitals:   10/16/24 1503 10/16/24 1504  BP: (!) 167/127 130/71  Pulse:    Resp:    Temp:  SpO2:       Physical Exam Vitals and nursing note reviewed.  During triage patient was hypertensive, when rechecked blood pressure was normotensive.  General:          Awake, no distress.  Patient is oriented x 4. Head: Presence of right forehead abrasion of the skin with active bleeding.  No laceration.  Presence of hematoma under the abrasion of the skin.  Scalp is intact, no tenderness to palpation, no depressions. Face: Presence of abrasion of the skin in the proximal third of the nasal bridge.  No active bleeding.  No deformities.  No depressions.  Abrasion of the skin in the right side of upper lip, no active bleeding, no lacerations. Eyes: PERRLA Ears: Patient is using hearing devices. Nose: Presence of dried  blood in the right nostril.  No evidence of septal hematoma, no difficulty breathing.  No active bleeding. Neck: Skin is intact, no tenderness to palpation of cervical spinal process or paraspinal muscles.  Full ROM. CV:                  Good peripheral perfusion.  Irregular rhythm, systolic murmur left sternal border.  Resp:               Normal effort. no tachypnea.  No wheezing Abd:                 No distention.  Soft nontender Other:     Anterior chest: Skin is intact, no ecchymosis or hematomas.  No tenderness to palpation of the ribs. Upper extremities: Skin is intact, no tenderness to palpation of the shoulders, clavicle, humerus or forearm.  Full ROM.  Pulses positive.  Sensation is intact     Lower extremities: Skin is intact, no tenderness to palpation, no ecchymosis or hematomas.  Full ROM.  Pulses positive.  Sensation is intact.      ED Results / Procedures / Treatments   Labs (all labs ordered are listed, but only abnormal results are displayed) Labs Reviewed  COMPREHENSIVE METABOLIC PANEL WITH GFR - Abnormal; Notable for the following components:      Result Value   Glucose, Bld 105 (*)    All other components within normal limits  CBC - Abnormal; Notable for the following components:   RBC 3.91 (*)    HCT 38.3 (*)    All other components within normal limits  PROTIME-INR     EKG See physician read    RADIOLOGY I independently reviewed and interpreted imaging and agree with radiologists findings.      PROCEDURES:  Critical Care performed:   .Laceration Repair  Date/Time: 10/16/2024 4:30 PM  Performed by: Janit Kast, PA-C Authorized by: Janit Kast, PA-C   Consent:    Consent obtained:  Verbal   Consent given by:  Patient   Risks, benefits, and alternatives were discussed: yes     Risks discussed:  Infection and pain Universal protocol:    Procedure explained and questions answered to patient or proxy's satisfaction: yes     Patient  identity confirmed:  Verbally with patient Anesthesia:    Anesthesia method:  None Laceration details:    Location:  Face   Face location:  Forehead (Abrasion of the skin in the right forehead, nasal bridge, right upper lip)   Length (cm):  1   Depth (mm):  0.5 Pre-procedure details:    Preparation:  Imaging obtained to evaluate for foreign bodies Exploration:    Hemostasis achieved with:  Direct pressure   Imaging outcome: foreign body not noted     Wound exploration: entire depth of wound visualized     Contaminated: no   Treatment:    Area cleansed with:  Povidone-iodine   Amount of cleaning:  Standard   Irrigation solution:  Sterile saline   Irrigation volume:  300   Irrigation method:  Tap   Visualized foreign bodies/material removed: no     Debridement:  None   Undermining:  None   Scar revision: no   Skin repair:    Repair method:  Tissue adhesive Approximation:    Approximation:  Close Repair type:    Repair type:  Simple Post-procedure details:    Dressing:  Non-adherent dressing   Procedure completion:  Tolerated well, no immediate complications    MEDICATIONS ORDERED IN ED: Medications - No data to display Clinical Course as of 10/16/24 1706  Wed Oct 16, 2024  1513 CBC(!) White blood cells within normal limits, hemoglobin 13.1, platelets within normal limits. [AE]  1536 Protime-INR - (order if patient is taking Coumadin / Warfarin) Within normal limits [AE]  1538 Comprehensive metabolic panel(!) Electrolytes, renal function and liver function within normal limits. [AE]  1656 CT HEAD WO CONTRAST . No acute intracranial abnormality. 2. Forehead hematoma.   [AE]  [id number] Updated patient with head CT report.  Patient is ready for discharge.  Patient is agreeable with the plan. [AE]    Clinical Course User Index [AE] Janit Kast, PA-C    IMPRESSION / MDM / ASSESSMENT AND PLAN / ED COURSE  I reviewed the triage vital signs and the nursing  notes.  Differential diagnosis includes, but is not limited to, fall, head injury, intracranial hemorrhage, skull fracture, abrasion of the skin, abrasion of the skin, rib fracture.  Patient's presentation is most consistent with acute complicated illness / injury requiring diagnostic workup.   Lavante Toso is a 89 y.o., male who presents today after falling and hitting his head.  Patient is taking Plavix  and Eliquis .  Patient denies loss of consciousness, blurry vision, vomit.  On a physical exam vital signs were normal, presence of abrasion of the skin and hematoma in the right forehead.  Active bleeding.  Presence of abrasion of the skin in the nasal bridge proximal third, and right upper lip.  No active bleeding.  Cardiopulmonary, irregular rhythm, systolic murmur in the left lower sternal area.  No tenderness to palpation of the ribs.  Cervical spine with no tenderness to palpation over spinal processes or paraspinal muscles.  Upper and lower extremities without signs of trauma, full ROM, sensation is intact, pulses positive. Plan With results of CT of the head ordered during triage With results of CMP, CBC, pro time ordered in triage. Clean and apply dressing on his face. Reassess Patient's diagnosis is consistent with fall, traumatic hematoma of forehead, abrasion of the skin in forehead, nasal bridge, right upper lip. I independently reviewed and interpreted imaging and agree with radiologists findings,CT head ruled out  intracranial hemorrhage, skull fracture.  Labs are  reassuring ruling out electrolyte rearrangement.  Kidney function and liver function within normal limits.  White blood cells, hemoglobin and platelets within normal limits.  INR and prothrombin within normal limits. I did review the patient's allergies and medications.The patient is in stable and satisfactory condition for discharge home.  Patient will be discharged home without prescriptions.  I did advise patient to take  acetaminophen  in case of cephalalgia.  Patient is to follow up  with PCP as needed or otherwise directed. Patient is given ED precautions to return to the ED for any worsening or new symptoms. Discussed plan of care with patient, answered all of patient's questions, Patient agreeable to plan of care. Patient verbalized understanding.  FINAL CLINICAL IMPRESSION(S) / ED DIAGNOSES   Final diagnoses:  Fall, initial encounter  Abrasion of skin of face  Traumatic hematoma of forehead, initial encounter     Rx / DC Orders   ED Discharge Orders     None        Note:  This document was prepared using Dragon voice recognition software and may include unintentional dictation errors.   Janit Kast, PA-C 10/16/24 1706    Jacolyn Pae, MD 10/16/24 1956

## 2024-10-16 NOTE — Discharge Instructions (Addendum)
 You have been diagnosed with fall, traumatic hematoma of forehead, abrasion of the skin of the face.  Your head CT rule it out intracranial hemorrhage, skull fracture.  Please do not submerge your face in water.  You can change the Band-Aids in 48 hours.  Do not peel the surgical gel.  You can take Tylenol  every 6 hours as needed for pain.  Please come back to ED or go to your PCP if you have new symptoms or symptoms worsen.  It was a pleasure to help you today.  Aja Bolander, PA-C

## 2024-10-16 NOTE — ED Notes (Signed)
 Pt has a large hematoma to right forehead. No LOC. CAOx4.

## 2024-12-26 ENCOUNTER — Other Ambulatory Visit: Payer: Self-pay | Admitting: Urology

## 2024-12-26 DIAGNOSIS — R351 Nocturia: Secondary | ICD-10-CM

## 2024-12-26 DIAGNOSIS — N138 Other obstructive and reflux uropathy: Secondary | ICD-10-CM

## 2025-05-19 ENCOUNTER — Ambulatory Visit: Admitting: Urology
